# Patient Record
Sex: Female | Born: 1970
Health system: Southern US, Community
[De-identification: ages and names within clinical notes are randomized; demographics above are authoritative.]

## PROBLEM LIST (undated history)

## (undated) DIAGNOSIS — I7775 Dissection of other precerebral arteries: Secondary | ICD-10-CM

## (undated) HISTORY — PX: ABDOMINAL HYSTERECTOMY: SHX81

---

## 2013-08-07 ENCOUNTER — Emergency Department (HOSPITAL_BASED_OUTPATIENT_CLINIC_OR_DEPARTMENT_OTHER)
Admission: EM | Admit: 2013-08-07 | Discharge: 2013-08-07 | Disposition: A | Discharge status: Home / Self Care | Payer: PRIVATE HEALTH INSURANCE | Attending: Emergency Medicine | Admitting: Emergency Medicine

## 2013-08-07 ENCOUNTER — Encounter (HOSPITAL_BASED_OUTPATIENT_CLINIC_OR_DEPARTMENT_OTHER): Payer: Self-pay | Admitting: *Deleted

## 2013-08-07 ENCOUNTER — Emergency Department (HOSPITAL_BASED_OUTPATIENT_CLINIC_OR_DEPARTMENT_OTHER): Payer: PRIVATE HEALTH INSURANCE

## 2013-08-07 DIAGNOSIS — J209 Acute bronchitis, unspecified: Secondary | ICD-10-CM | POA: Insufficient documentation

## 2013-08-07 DIAGNOSIS — Z880 Allergy status to penicillin: Secondary | ICD-10-CM | POA: Insufficient documentation

## 2013-08-07 DIAGNOSIS — R0602 Shortness of breath: Secondary | ICD-10-CM | POA: Insufficient documentation

## 2013-08-07 DIAGNOSIS — R0789 Other chest pain: Secondary | ICD-10-CM | POA: Insufficient documentation

## 2013-08-07 DIAGNOSIS — R05 Cough: Secondary | ICD-10-CM | POA: Insufficient documentation

## 2013-08-07 DIAGNOSIS — J40 Bronchitis, not specified as acute or chronic: Secondary | ICD-10-CM

## 2013-08-07 DIAGNOSIS — R059 Cough, unspecified: Secondary | ICD-10-CM | POA: Insufficient documentation

## 2013-08-07 LAB — CBC WITH DIFFERENTIAL/PLATELET
Eosinophils Absolute: 0.5 10*3/uL (ref 0.0–0.7)
Eosinophils Relative: 5 % (ref 0–5)
HCT: 39.7 % (ref 36.0–46.0)
Hemoglobin: 13.2 g/dL (ref 12.0–15.0)
Lymphs Abs: 2.5 10*3/uL (ref 0.7–4.0)
MCH: 28.6 pg (ref 26.0–34.0)
MCV: 85.9 fL (ref 78.0–100.0)
Monocytes Relative: 6 % (ref 3–12)
RBC: 4.62 MIL/uL (ref 3.87–5.11)

## 2013-08-07 LAB — BASIC METABOLIC PANEL
BUN: 15 mg/dL (ref 6–23)
Calcium: 9.7 mg/dL (ref 8.4–10.5)
GFR calc non Af Amer: 79 mL/min — ABNORMAL LOW (ref >90)
Glucose, Bld: 104 mg/dL — ABNORMAL HIGH (ref 70–99)

## 2013-08-07 LAB — PRO B NATRIURETIC PEPTIDE: Pro B Natriuretic peptide (BNP): 24.9 pg/mL (ref 0–125)

## 2013-08-07 MED ORDER — AEROCHAMBER Z-STAT PLUS/MEDIUM MISC
1.0000 | Freq: Once | Status: AC
  Administered 2013-08-07: 1
  Filled 2013-08-07: qty 1

## 2013-08-07 MED ORDER — AZITHROMYCIN 250 MG PO TABS: ORAL_TABLET | ORAL | Status: DC

## 2013-08-07 MED ORDER — ALBUTEROL SULFATE HFA 108 (90 BASE) MCG/ACT IN AERS
2.0000 | INHALATION_SPRAY | RESPIRATORY_TRACT | Status: DC | PRN
  Administered 2013-08-07: 2 via RESPIRATORY_TRACT
  Filled 2013-08-07: qty 6.7

## 2013-08-07 NOTE — ED Provider Notes (Signed)
CSN: [id number]     Arrival date & time 08/07/13  4098 History     First MD Initiated Contact with Patient 08/07/13 1019     Chief Complaint  Patient presents with  . Chest Pain   (Consider location/radiation/quality/duration/timing/severity/associated sxs/prior Treatment) HPI Comments: Patient presents with one week history of central chest pain that is constant. It does not radiate. It is worse with coughing. She endorses a two-month history of productive cough with yellow mucus. Denies having this investigated. Denies any fevers, chills, weight loss, nausea or vomiting. She's felt warm and checked her temperature. Good by mouth intake and urine output. Denies any sick contacts. She does not smoke. Denies any cardiac history. Pain is better with rest and worse with palpation and movement of the chest.  The history is provided by the patient.    History reviewed. No pertinent past medical history. Past Surgical History  Procedure Laterality Date  . Abdominal hysterectomy     No family history on file. History  Substance Use Topics  . Smoking status: Never Smoker   . Smokeless tobacco: Never Used  . Alcohol Use: No   OB History   Grav Para Term Preterm Abortions TAB SAB Ect Mult Living                 Review of Systems  Constitutional: Negative for fever, activity change and appetite change.  HENT: Negative for congestion and rhinorrhea.   Respiratory: Positive for cough, chest tightness and shortness of breath.   Cardiovascular: Positive for chest pain.  Gastrointestinal: Negative for nausea, vomiting and abdominal pain.  Genitourinary: Negative for dysuria, hematuria, vaginal bleeding and vaginal discharge.  Musculoskeletal: Negative for back pain.  Skin: Negative for rash.  Neurological: Negative for headaches.  A complete 10 system review of systems was obtained and all systems are negative except as noted in the HPI and PMH.    Allergies  Penicillins  Home  Medications   Current Outpatient Rx  Name  Route  Sig  Dispense  Refill  . azithromycin (ZITHROMAX Z-PAK) 250 MG tablet      2 tablets PO today, then 1 tablet PO daily   6 each   0    BP 110/67  Pulse 95  Temp(Src) 98.9 F (37.2 C) (Oral)  Resp 16  SpO2 98% Physical Exam  Constitutional: She is oriented to person, place, and time. She appears well-developed and well-nourished. No distress.  HENT:  Head: Normocephalic and atraumatic.  Mouth/Throat: Oropharynx is clear and moist. No oropharyngeal exudate.  Eyes: Conjunctivae and EOM are normal. Pupils are equal, round, and reactive to light.  Neck: Normal range of motion. Neck supple.  Cardiovascular: Normal rate, regular rhythm and normal heart sounds.   No murmur heard. Pulmonary/Chest: Effort normal and breath sounds normal. No respiratory distress. She exhibits tenderness.  TTP sternal and L costochondral junction. No rash  Abdominal: Soft. There is no tenderness. There is no rebound and no guarding.  Musculoskeletal: Normal range of motion. She exhibits no edema and no tenderness.  Neurological: She is alert and oriented to person, place, and time. No cranial nerve deficit. She exhibits normal muscle tone. Coordination normal.  Skin: Skin is warm.    ED Course   Procedures (including critical care time)  Labs Reviewed  BASIC METABOLIC PANEL - Abnormal; Notable for the following:    Glucose, Bld 104 (*)    GFR calc non Af Amer 79 (*)    All other components within  normal limits  CBC WITH DIFFERENTIAL  TROPONIN I  D-DIMER, QUANTITATIVE  PRO B NATRIURETIC PEPTIDE   Dg Chest 2 View  08/07/2013   *RADIOLOGY REPORT*  Clinical Data: Chest pain, cough  CHEST - 2 VIEW  Comparison: None.  Findings: Cardiomediastinal silhouette is unremarkable.  No acute infiltrate or pleural effusion.  No pulmonary edema.  Mild basilar atelectasis.  Bony thorax is unremarkable.  IMPRESSION: No acute infiltrate or pulmonary edema.  Mild  basilar atelectasis.   Original Report Authenticated By: Natasha Mead, M.D.   1. Bronchitis     MDM  One week of constant chest pain associated with two-month history of cough. No distress. No hypoxia. Lungs clear. Chest pain is constant and reproducible and atypical for ACS. CXR negative. D-dimer negative. No desaturation with ambulation. Will treat for bronchitis with zithromax.  Needs to establish care with PCP.   Date: 08/07/2013  Rate: 86  Rhythm: normal sinus rhythm  QRS Axis: normal  Intervals: normal  ST/T Wave abnormalities: normal  Conduction Disutrbances:none  Narrative Interpretation:   Old EKG Reviewed: Alla German, MD 08/07/13 [phone]

## 2013-08-07 NOTE — ED Notes (Signed)
Patient states she has a one week history of central chest pain which she describes as a constant burning pain.  States she has a two month history of a productive cough with yellow secretions.  Pain is associated with sob while coughing.

## 2013-08-07 NOTE — ED Notes (Signed)
HR 97 resting aftering walking one lap in ER HR was 112.  SPO2 100 resting aftering walking one lap in ER SPO2 was 100.  Patient did have some increased WOB.

## 2016-01-06 ENCOUNTER — Encounter (HOSPITAL_BASED_OUTPATIENT_CLINIC_OR_DEPARTMENT_OTHER): Payer: Self-pay | Admitting: Emergency Medicine

## 2016-01-06 ENCOUNTER — Emergency Department (HOSPITAL_BASED_OUTPATIENT_CLINIC_OR_DEPARTMENT_OTHER): Payer: BLUE CROSS/BLUE SHIELD

## 2016-01-06 ENCOUNTER — Emergency Department (HOSPITAL_BASED_OUTPATIENT_CLINIC_OR_DEPARTMENT_OTHER)
Admission: EM | Admit: 2016-01-06 | Discharge: 2016-01-06 | Disposition: A | Discharge status: Home / Self Care | Payer: BLUE CROSS/BLUE SHIELD | Attending: Emergency Medicine | Admitting: Emergency Medicine

## 2016-01-06 DIAGNOSIS — R109 Unspecified abdominal pain: Secondary | ICD-10-CM | POA: Insufficient documentation

## 2016-01-06 DIAGNOSIS — M7989 Other specified soft tissue disorders: Secondary | ICD-10-CM

## 2016-01-06 DIAGNOSIS — Z792 Long term (current) use of antibiotics: Secondary | ICD-10-CM | POA: Diagnosis not present

## 2016-01-06 DIAGNOSIS — R197 Diarrhea, unspecified: Secondary | ICD-10-CM | POA: Insufficient documentation

## 2016-01-06 DIAGNOSIS — M79661 Pain in right lower leg: Secondary | ICD-10-CM | POA: Diagnosis not present

## 2016-01-06 DIAGNOSIS — J029 Acute pharyngitis, unspecified: Secondary | ICD-10-CM | POA: Diagnosis not present

## 2016-01-06 DIAGNOSIS — M549 Dorsalgia, unspecified: Secondary | ICD-10-CM | POA: Diagnosis not present

## 2016-01-06 DIAGNOSIS — M79662 Pain in left lower leg: Secondary | ICD-10-CM | POA: Diagnosis not present

## 2016-01-06 DIAGNOSIS — R630 Anorexia: Secondary | ICD-10-CM | POA: Insufficient documentation

## 2016-01-06 DIAGNOSIS — Z880 Allergy status to penicillin: Secondary | ICD-10-CM | POA: Diagnosis not present

## 2016-01-06 DIAGNOSIS — R51 Headache: Secondary | ICD-10-CM | POA: Diagnosis not present

## 2016-01-06 DIAGNOSIS — R079 Chest pain, unspecified: Secondary | ICD-10-CM | POA: Insufficient documentation

## 2016-01-06 DIAGNOSIS — M79606 Pain in leg, unspecified: Secondary | ICD-10-CM

## 2016-01-06 LAB — CBC WITH DIFFERENTIAL/PLATELET
Basophils Absolute: 0.1 K/uL (ref 0.0–0.1)
Basophils Relative: 1 %
Eosinophils Absolute: 0.3 K/uL (ref 0.0–0.7)
Eosinophils Relative: 3 %
HCT: 41.6 % (ref 36.0–46.0)
Hemoglobin: 13.3 g/dL (ref 12.0–15.0)
Lymphocytes Relative: 24 %
Lymphs Abs: 2.5 K/uL (ref 0.7–4.0)
MCH: 27.2 pg (ref 26.0–34.0)
MCHC: 32 g/dL (ref 30.0–36.0)
MCV: 85.1 fL (ref 78.0–100.0)
Monocytes Absolute: 0.6 K/uL (ref 0.1–1.0)
Monocytes Relative: 6 %
Neutro Abs: 7.1 K/uL (ref 1.7–7.7)
Neutrophils Relative %: 66 %
Platelets: 304 K/uL (ref 150–400)
RBC: 4.89 MIL/uL (ref 3.87–5.11)
RDW: 12.6 % (ref 11.5–15.5)
WBC: 10.5 K/uL (ref 4.0–10.5)

## 2016-01-06 LAB — BASIC METABOLIC PANEL
ANION GAP: 10 (ref 5–15)
BUN: 10 mg/dL (ref 6–20)
CALCIUM: 8.3 mg/dL — AB (ref 8.9–10.3)
CO2: 26 mmol/L (ref 22–32)
CREATININE: 0.76 mg/dL (ref 0.44–1.00)
Chloride: 105 mmol/L (ref 101–111)
GFR calc Af Amer: >60 mL/min (ref >60)
GFR calc non Af Amer: >60 mL/min (ref >60)
GLUCOSE: 86 mg/dL (ref 65–99)
Potassium: 3.8 mmol/L (ref 3.5–5.1)
Sodium: 141 mmol/L (ref 135–145)

## 2016-01-06 LAB — CK: Total CK: 177 U/L (ref 38–234)

## 2016-01-06 LAB — D-DIMER, QUANTITATIVE: D-Dimer, Quant: <0.27 ug{FEU}/mL (ref 0.00–0.50)

## 2016-01-06 MED ORDER — SODIUM CHLORIDE 0.9 % IV BOLUS (SEPSIS)
1000.0000 mL | Freq: Once | INTRAVENOUS | Status: AC
  Administered 2016-01-06: 1000 mL via INTRAVENOUS

## 2016-01-06 NOTE — ED Notes (Signed)
Pt reports that she has had bilateral leg swelling since first of the year along with pain in bilateral calfs

## 2016-01-06 NOTE — ED Provider Notes (Signed)
CSN: [id number]     Arrival date & time 01/06/16  1058 History   First MD Initiated Contact with Patient 01/06/16 1123     Chief Complaint  Patient presents with  . Leg Swelling     (Consider location/radiation/quality/duration/timing/severity/associated sxs/prior Treatment) Patient is a 45 y.o. female presenting with leg pain.  Leg Pain Location:  Leg Leg location:  R lower leg and L lower leg Pain details:    Quality:  Aching, cramping and sharp   Radiates to:  Does not radiate   Severity:  No pain   Onset quality:  Gradual   Duration:  3 days   Timing:  Constant   Progression:  Worsening Chronicity:  New Dislocation: no   Prior injury to area:  No Relieved by:  None tried Worsened by:  Nothing tried Ineffective treatments:  None tried Associated symptoms: back pain   Associated symptoms: no fatigue and no muscle weakness     History reviewed. No pertinent past medical history. Past Surgical History  Procedure Laterality Date  . Abdominal hysterectomy     History reviewed. No pertinent family history. Social History  Substance Use Topics  . Smoking status: Never Smoker   . Smokeless tobacco: Never Used  . Alcohol Use: No   OB History    No data available     Review of Systems  Constitutional: Positive for activity change. Negative for fatigue.  HENT: Positive for sore throat.   Eyes: Negative for pain and redness.  Respiratory: Negative for cough and shortness of breath.   Cardiovascular: Positive for chest pain and leg swelling.  Gastrointestinal: Positive for abdominal pain and diarrhea. Negative for nausea and vomiting.  Endocrine: Negative for polydipsia, polyphagia and polyuria.  Genitourinary: Negative for dysuria and dyspareunia.  Musculoskeletal: Positive for back pain and arthralgias.  Neurological: Positive for headaches. Negative for syncope.  All other systems reviewed and are negative.     Allergies  Penicillins  Home Medications    Prior to Admission medications   Medication Sig Start Date End Date Taking? Authorizing Provider  ibuprofen (ADVIL,MOTRIN) 200 MG tablet Take 200 mg by mouth every 6 (six) hours as needed.   Yes Historical Provider, MD  azithromycin (ZITHROMAX Z-PAK) 250 MG tablet 2 tablets PO today, then 1 tablet PO daily 08/07/13   Glynn Octave, MD   BP 148/99 mmHg  Pulse 98  Temp(Src) 98.2 F (36.8 C) (Oral)  Resp 20  Ht  (1.549 m)  Wt 210 lb (95.255 kg)  BMI 39.70 kg/m2  SpO2 100% Physical Exam  Constitutional: She is oriented to person, place, and time. She appears well-developed and well-nourished.  HENT:  Head: Normocephalic and atraumatic.  Neck: Normal range of motion.  Cardiovascular: Normal rate and regular rhythm.   Pulmonary/Chest: Effort normal and breath sounds normal. No stridor. No respiratory distress. She has no wheezes.  Abdominal: She exhibits no distension.  Neurological: She is alert and oriented to person, place, and time. No cranial nerve deficit.  Skin: Skin is warm and dry.  Nursing note and vitals reviewed.   ED Course  Procedures (including critical care time) Labs Review Labs Reviewed  BASIC METABOLIC PANEL - Abnormal; Notable for the following:    Calcium 8.3 (*)    All other components within normal limits  CBC WITH DIFFERENTIAL/PLATELET  D-DIMER, QUANTITATIVE (NOT AT [place])  CK  CBC WITH DIFFERENTIAL/PLATELET    Imaging Review Dg Chest 2 View  01/06/2016  CLINICAL DATA:  Bilateral lower  leg pain and swelling, right greater than left. Shortness of breath starting 18 days ago EXAM: CHEST  2 VIEW COMPARISON:  08/07/2013 FINDINGS: The heart size and mediastinal contours are within normal limits. Both lungs are clear. The visualized skeletal structures are unremarkable. IMPRESSION: No active cardiopulmonary disease. Electronically Signed   By: Gaylyn Rong M.D.   On: 01/06/2016 12:59   I have personally reviewed and evaluated these images and lab  results as part of my medical decision-making.   EKG Interpretation   Date/Time:  Thursday January 06 2016 12:13:21 EST Ventricular Rate:  92 PR Interval:  168 QRS Duration: 94 QT Interval:  381 QTC Calculation: 471 R Axis:   47 Text Interpretation:  Sinus rhythm Baseline wander in lead(s) II No  significant change since last tracing 08/07/13 Confirmed by [facility] MD,  Barbara Cower [phone]) on 01/06/2016 12:23:44 PM      MDM   Final diagnoses:  Pain of lower extremity, unspecified laterality  Leg swelling    Pan positive ROS however it seems the patient is mainly here for BLE swelling worsening over last few days, improving at night and persistent distal leg pain as well. 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 Able to ambulate, will dc to fu w/ PCP and use iburpofen and continue weight loss management.     Marily Memos, MD 01/06/16 1524

## 2016-03-13 ENCOUNTER — Emergency Department (HOSPITAL_BASED_OUTPATIENT_CLINIC_OR_DEPARTMENT_OTHER)
Admission: EM | Admit: 2016-03-13 | Discharge: 2016-03-13 | Disposition: A | Discharge status: Home / Self Care | Payer: BLUE CROSS/BLUE SHIELD | Attending: Emergency Medicine | Admitting: Emergency Medicine

## 2016-03-13 ENCOUNTER — Emergency Department (HOSPITAL_BASED_OUTPATIENT_CLINIC_OR_DEPARTMENT_OTHER): Payer: BLUE CROSS/BLUE SHIELD

## 2016-03-13 ENCOUNTER — Encounter (HOSPITAL_BASED_OUTPATIENT_CLINIC_OR_DEPARTMENT_OTHER): Payer: Self-pay | Admitting: *Deleted

## 2016-03-13 DIAGNOSIS — J069 Acute upper respiratory infection, unspecified: Secondary | ICD-10-CM | POA: Diagnosis not present

## 2016-03-13 DIAGNOSIS — R059 Cough, unspecified: Secondary | ICD-10-CM

## 2016-03-13 DIAGNOSIS — R05 Cough: Secondary | ICD-10-CM | POA: Diagnosis present

## 2016-03-13 DIAGNOSIS — Z792 Long term (current) use of antibiotics: Secondary | ICD-10-CM | POA: Diagnosis not present

## 2016-03-13 DIAGNOSIS — R112 Nausea with vomiting, unspecified: Secondary | ICD-10-CM | POA: Insufficient documentation

## 2016-03-13 DIAGNOSIS — Z880 Allergy status to penicillin: Secondary | ICD-10-CM | POA: Insufficient documentation

## 2016-03-13 MED ORDER — PHENYLEPH-CARBETAPENTANE-GG 10-25-400 MG PO CAPS: 1.0000 | ORAL_CAPSULE | Freq: Four times a day (QID) | ORAL | Status: DC | PRN

## 2016-03-13 NOTE — Discharge Instructions (Signed)

## 2016-03-13 NOTE — ED Provider Notes (Signed)
CSN: [id number]     Arrival date & time 03/13/16  1521 History   First MD Initiated Contact with Patient 03/13/16 1608     Chief Complaint  Patient presents with  . Cough     (Consider location/radiation/quality/duration/timing/severity/associated sxs/prior Treatment) Patient is a 45 y.o. female presenting with cough. The history is provided by the patient. No language interpreter was used.  Cough Cough characteristics:  Productive Sputum characteristics:  Yellow Severity:  Moderate Onset quality:  Gradual Duration:  8 weeks Timing:  Intermittent Progression:  Waxing and waning Chronicity:  Recurrent Smoker: no   Associated symptoms: fever and rhinorrhea     History reviewed. No pertinent past medical history. Past Surgical History  Procedure Laterality Date  . Abdominal hysterectomy     No family history on file. Social History  Substance Use Topics  . Smoking status: Never Smoker   . Smokeless tobacco: Never Used  . Alcohol Use: No   OB History    No data available     Review of Systems  Constitutional: Positive for fever.  HENT: Positive for rhinorrhea.   Respiratory: Positive for cough.   Gastrointestinal: Positive for nausea and vomiting.  All other systems reviewed and are negative.     Allergies  Penicillins  Home Medications   Prior to Admission medications   Medication Sig Start Date End Date Taking? Authorizing Provider  azithromycin (ZITHROMAX Z-PAK) 250 MG tablet 2 tablets PO today, then 1 tablet PO daily 08/07/13   Glynn OctaveStephen Rancour, MD  ibuprofen (ADVIL,MOTRIN) 200 MG tablet Take 200 mg by mouth every 6 (six) hours as needed.    Historical Provider, MD   BP 142/98 mmHg  Pulse 118  Temp(Src) 99.2 F (37.3 C) (Oral)  Resp 18  Ht 5\' 2"  (1.575 m)  Wt 95.255 kg  BMI 38.40 kg/m2  SpO2 98% Physical Exam  Constitutional: She is oriented to person, place, and time. She appears well-developed and well-nourished.  HENT:  Head: Normocephalic.   Mouth/Throat: No oropharyngeal exudate.  Eyes: Conjunctivae are normal.  Neck: Neck supple.  Cardiovascular: Regular rhythm and intact distal pulses.   Pulmonary/Chest: Effort normal and breath sounds normal. No respiratory distress. She has no wheezes.  Abdominal: Soft.  Musculoskeletal: She exhibits no edema or tenderness.  Lymphadenopathy:    She has no cervical adenopathy.  Neurological: She is alert and oriented to person, place, and time.  Skin: Skin is warm and dry.  Psychiatric: She has a normal mood and affect.  Nursing note and vitals reviewed.   ED Course  Procedures (including critical care time) Labs Review Labs Reviewed - No data to display  Imaging Review Dg Chest 2 View  03/13/2016  CLINICAL DATA:  Cough for 2 months EXAM: CHEST  2 VIEW COMPARISON:  02/29/2016 FINDINGS: The heart size and mediastinal contours are within normal limits. Both lungs are clear. The visualized skeletal structures are unremarkable. IMPRESSION: No active cardiopulmonary disease. Electronically Signed   By: Alcide CleverMark  Lukens M.D.   On: 03/13/2016 15:41   I have personally reviewed and evaluated these images and lab results as part of my medical decision-making.   EKG Interpretation None     Radiology results reviewed and shared with patient. MDM   Final diagnoses:  None    Viral URI with cough. Symptomatic care instructions provided. Return precautions discussed. Follow-up with PCP.    Felicie Mornavid Kailyn Vanderslice, NP 03/14/16 [id number]  Richardean Canalavid H Yao, MD 03/14/16 [phone]

## 2016-03-13 NOTE — ED Notes (Signed)
Cough x 2 months. Thick yellow sputum.

## 2017-01-29 ENCOUNTER — Encounter (HOSPITAL_BASED_OUTPATIENT_CLINIC_OR_DEPARTMENT_OTHER): Payer: Self-pay | Admitting: *Deleted

## 2017-01-29 ENCOUNTER — Emergency Department (HOSPITAL_BASED_OUTPATIENT_CLINIC_OR_DEPARTMENT_OTHER)
Admission: EM | Admit: 2017-01-29 | Discharge: 2017-01-29 | Disposition: A | Discharge status: Home / Self Care | Payer: PRIVATE HEALTH INSURANCE | Attending: Emergency Medicine | Admitting: Emergency Medicine

## 2017-01-29 DIAGNOSIS — R69 Illness, unspecified: Secondary | ICD-10-CM

## 2017-01-29 DIAGNOSIS — J111 Influenza due to unidentified influenza virus with other respiratory manifestations: Secondary | ICD-10-CM

## 2017-01-29 DIAGNOSIS — H66002 Acute suppurative otitis media without spontaneous rupture of ear drum, left ear: Secondary | ICD-10-CM | POA: Insufficient documentation

## 2017-01-29 DIAGNOSIS — R197 Diarrhea, unspecified: Secondary | ICD-10-CM | POA: Insufficient documentation

## 2017-01-29 DIAGNOSIS — R112 Nausea with vomiting, unspecified: Secondary | ICD-10-CM | POA: Insufficient documentation

## 2017-01-29 DIAGNOSIS — J0110 Acute frontal sinusitis, unspecified: Secondary | ICD-10-CM

## 2017-01-29 LAB — COMPREHENSIVE METABOLIC PANEL
ALBUMIN: 3.5 g/dL (ref 3.5–5.0)
ALT: 16 U/L (ref 14–54)
ANION GAP: 9 (ref 5–15)
AST: 26 U/L (ref 15–41)
Alkaline Phosphatase: 80 U/L (ref 38–126)
BILIRUBIN TOTAL: 0.6 mg/dL (ref 0.3–1.2)
BUN: 13 mg/dL (ref 6–20)
CHLORIDE: 104 mmol/L (ref 101–111)
CO2: 30 mmol/L (ref 22–32)
Calcium: 8.6 mg/dL — ABNORMAL LOW (ref 8.9–10.3)
Creatinine, Ser: 0.88 mg/dL (ref 0.44–1.00)
GFR calc Af Amer: >60 mL/min (ref >60)
GFR calc non Af Amer: >60 mL/min (ref >60)
GLUCOSE: 117 mg/dL — AB (ref 65–99)
POTASSIUM: 3.8 mmol/L (ref 3.5–5.1)
SODIUM: 143 mmol/L (ref 135–145)
TOTAL PROTEIN: 7.3 g/dL (ref 6.5–8.1)

## 2017-01-29 LAB — CBC WITH DIFFERENTIAL/PLATELET
BASOS PCT: 0 %
Basophils Absolute: 0 10*3/uL (ref 0.0–0.1)
EOS ABS: 0.2 10*3/uL (ref 0.0–0.7)
EOS PCT: 2 %
HCT: 43.7 % (ref 36.0–46.0)
Hemoglobin: 14.1 g/dL (ref 12.0–15.0)
Lymphocytes Relative: 15 %
Lymphs Abs: 1 10*3/uL (ref 0.7–4.0)
MCH: 27.8 pg (ref 26.0–34.0)
MCHC: 32.3 g/dL (ref 30.0–36.0)
MCV: 86.2 fL (ref 78.0–100.0)
MONO ABS: 0.7 10*3/uL (ref 0.1–1.0)
MONOS PCT: 11 %
Neutro Abs: 4.7 10*3/uL (ref 1.7–7.7)
Neutrophils Relative %: 72 %
PLATELETS: 242 10*3/uL (ref 150–400)
RBC: 5.07 MIL/uL (ref 3.87–5.11)
RDW: 12.8 % (ref 11.5–15.5)
WBC: 6.6 10*3/uL (ref 4.0–10.5)

## 2017-01-29 LAB — LIPASE, BLOOD: Lipase: 28 U/L (ref 11–51)

## 2017-01-29 MED ORDER — BENZONATATE 100 MG PO CAPS: 100.0000 mg | ORAL_CAPSULE | Freq: Three times a day (TID) | ORAL | 0 refills | Status: DC

## 2017-01-29 MED ORDER — ONDANSETRON HCL 4 MG/2ML IJ SOLN
4.0000 mg | Freq: Once | INTRAMUSCULAR | Status: AC
  Administered 2017-01-29: 4 mg via INTRAVENOUS
  Filled 2017-01-29: qty 2

## 2017-01-29 MED ORDER — HYDROCOD POLST-CPM POLST ER 10-8 MG/5ML PO SUER
5.0000 mL | Freq: Once | ORAL | Status: AC
  Administered 2017-01-29: 5 mL via ORAL
  Filled 2017-01-29: qty 5

## 2017-01-29 MED ORDER — IBUPROFEN 800 MG PO TABS
800.0000 mg | ORAL_TABLET | Freq: Once | ORAL | Status: AC
  Administered 2017-01-29: 800 mg via ORAL
  Filled 2017-01-29: qty 1

## 2017-01-29 MED ORDER — AMOXICILLIN-POT CLAVULANATE 875-125 MG PO TABS: 1.0000 | ORAL_TABLET | Freq: Two times a day (BID) | ORAL | 0 refills | Status: DC

## 2017-01-29 MED ORDER — ACETAMINOPHEN 500 MG PO TABS
1000.0000 mg | ORAL_TABLET | Freq: Once | ORAL | Status: AC
  Administered 2017-01-29: 1000 mg via ORAL
  Filled 2017-01-29: qty 2

## 2017-01-29 MED ORDER — SODIUM CHLORIDE 0.9 % IV BOLUS (SEPSIS)
1000.0000 mL | Freq: Once | INTRAVENOUS | Status: AC
  Administered 2017-01-29: 1000 mL via INTRAVENOUS

## 2017-01-29 MED FILL — AMOX-CLAV 875-125 MG TABLET: 875-125 | 7 days supply | Qty: 14 | Fill #0

## 2017-01-29 MED FILL — BENZONATATE 100 MG CAP: 100 | 7 days supply | Qty: 21 | Fill #0

## 2017-01-29 NOTE — ED Provider Notes (Signed)
[facility]   CSN: [id number] Arrival date & time: 01/29/17  [id number]     History   Chief Complaint Chief Complaint  Patient presents with  . vomting and cough    HPI Diane Reese is a 46 y.o. female.  46 yo F with a chief complaint of cough congestion fevers chills myalgias nausea vomiting and diarrhea. Husband has a similar illness. Going on for the past couple days. Thinks she's been having some wheezing as well. Denies history of smoking. Has history of asthma.   The history is provided by the patient.  Illness  This is a new problem. The current episode started more than 2 days ago. The problem occurs constantly. The problem has not changed since onset.Pertinent negatives include no chest pain, no headaches and no shortness of breath. Nothing aggravates the symptoms. Nothing relieves the symptoms. She has tried nothing for the symptoms. The treatment provided no relief.    History reviewed. No pertinent past medical history.  There are no active problems to display for this patient.   Past Surgical History:  Procedure Laterality Date  . ABDOMINAL HYSTERECTOMY      OB History    No data available       Home Medications    Prior to Admission medications   Medication Sig Start Date End Date Taking? Authorizing Provider  azithromycin (ZITHROMAX Z-PAK) 250 MG tablet 2 tablets PO today, then 1 tablet PO daily 08/07/13   Glynn OctaveStephen Rancour, MD  ibuprofen (ADVIL,MOTRIN) 200 MG tablet Take 200 mg by mouth every 6 (six) hours as needed.    Historical Provider, MD  Phenyleph-Carbetapentane-GG 10-25-400 MG CAPS Take 1 capsule by mouth every 6 (six) hours as needed. 03/13/16   Felicie Mornavid Smith, NP    Family History No family history on file.  Social History Social History  Substance Use Topics  . Smoking status: Never Smoker  . Smokeless tobacco: Never Used  . Alcohol use No     Allergies   Penicillins   Review of Systems Review of Systems    Constitutional: Negative for chills and fever.  HENT: Positive for congestion. Negative for rhinorrhea.   Eyes: Negative for redness and visual disturbance.  Respiratory: Positive for cough. Negative for shortness of breath and wheezing.   Cardiovascular: Negative for chest pain and palpitations.  Gastrointestinal: Positive for diarrhea, nausea and vomiting.  Genitourinary: Negative for dysuria and urgency.  Musculoskeletal: Positive for arthralgias and myalgias.  Skin: Negative for pallor and wound.  Neurological: Negative for dizziness and headaches.     Physical Exam Updated Vital Signs BP (!) 150/104 (BP Location: Left Arm) Comment (BP Location): left forearm  Pulse 96   Temp 99.7 F (37.6 C) (Oral)   Resp 16   Ht 5\' 1"  (1.549 m)   Wt 239 lb (108.4 kg)   SpO2 96%   BMI 45.16 kg/m   Physical Exam  Constitutional: She is oriented to person, place, and time. She appears well-developed and well-nourished. No distress.  HENT:  Head: Normocephalic and atraumatic.  Left TM with effusion, erythema and slight bulging  Eyes: EOM are normal. Pupils are equal, round, and reactive to light.  Neck: Normal range of motion. Neck supple.  Cardiovascular: Normal rate and regular rhythm.  Exam reveals no gallop and no friction rub.   No murmur heard. Pulmonary/Chest: Effort normal. She has no wheezes. She has no rales.  Abdominal: Soft. She exhibits no distension and no mass. There is no tenderness.  There is no guarding.  Musculoskeletal: She exhibits no edema or tenderness.  Neurological: She is alert and oriented to person, place, and time.  Skin: Skin is warm and dry. She is not diaphoretic.  Psychiatric: She has a normal mood and affect. Her behavior is normal.  Nursing note and vitals reviewed.    ED Treatments / Results  Labs (all labs ordered are listed, but only abnormal results are displayed) Labs Reviewed  CBC WITH DIFFERENTIAL/PLATELET  COMPREHENSIVE METABOLIC PANEL   LIPASE, BLOOD    EKG  EKG Interpretation None       Radiology No results found.  Procedures Procedures (including critical care time)  Medications Ordered in ED Medications  sodium chloride 0.9 % bolus 1,000 mL (not administered)  ondansetron (ZOFRAN) injection 4 mg (not administered)  acetaminophen (TYLENOL) tablet 1,000 mg (not administered)  ibuprofen (ADVIL,MOTRIN) tablet 800 mg (not administered)  chlorpheniramine-HYDROcodone (TUSSIONEX) 10-8 MG/5ML suspension 5 mL (not administered)     Initial Impression / Assessment and Plan / ED Course  I have reviewed the triage vital signs and the nursing notes.  Pertinent labs & imaging results that were available during my care of the patient were reviewed by me and considered in my medical decision making (see chart for details).     46 yo F With a chief complaints of influenza-like illness. Patient does have right frontal sinus tenderness and likely otitis media. Will cover with antibiotics.  7:34 AM:  I have discussed the diagnosis/risks/treatment options with the patient and believe the pt to be eligible for discharge home to follow-up with PCP. We also discussed returning to the ED immediately if new or worsening sx occur. We discussed the sx which are most concerning (e.g., sudden worsening pain, fever, inability to tolerate by mouth) that necessitate immediate return. Medications administered to the patient during their visit and any new prescriptions provided to the patient are listed below.  Medications given during this visit Medications  sodium chloride 0.9 % bolus 1,000 mL (not administered)  ondansetron (ZOFRAN) injection 4 mg (not administered)  acetaminophen (TYLENOL) tablet 1,000 mg (not administered)  ibuprofen (ADVIL,MOTRIN) tablet 800 mg (not administered)  chlorpheniramine-HYDROcodone (TUSSIONEX) 10-8 MG/5ML suspension 5 mL (not administered)     The patient appears reasonably screen and/or stabilized  for discharge and I doubt any other medical condition or other [facility] requiring further screening, evaluation, or treatment in the ED at this time prior to discharge.    Final Clinical Impressions(s) / ED Diagnoses   Final diagnoses:  Influenza-like illness  Acute frontal sinusitis, recurrence not specified  Acute suppurative otitis media of left ear without spontaneous rupture of tympanic membrane, recurrence not specified    New Prescriptions New Prescriptions   No medications on file     Melene Plan, DO 01/29/17 1518

## 2017-01-29 NOTE — ED Triage Notes (Addendum)
Pt states her husband has been sick with the stomach virus. States on Sat she started with vomiting and diarrhea. Denies fevers. C/o cough and congestion that started on Friday as well. States she has also had some wheezing as well. States hx of bronchitis but denies asthma history. Vomiting and diarrhea 30 min pta. Has tried ibuprofen without relief. Sob with exertion. Denies any at present. Faint wheezing noted on exam.

## 2017-01-29 NOTE — Discharge Instructions (Signed)
Take tylenol 2 pills 4 times a day and motrin 4 pills 3 times a day.  Drink plenty of fluids.  Return for worsening shortness of breath, headache, confusion. Follow up with your family doctor.   

## 2017-05-13 ENCOUNTER — Emergency Department (HOSPITAL_BASED_OUTPATIENT_CLINIC_OR_DEPARTMENT_OTHER): Payer: PRIVATE HEALTH INSURANCE

## 2017-05-13 ENCOUNTER — Emergency Department (HOSPITAL_BASED_OUTPATIENT_CLINIC_OR_DEPARTMENT_OTHER)
Admission: EM | Admit: 2017-05-13 | Discharge: 2017-05-13 | Disposition: A | Discharge status: Home / Self Care | Payer: PRIVATE HEALTH INSURANCE | Attending: Emergency Medicine | Admitting: Emergency Medicine

## 2017-05-13 ENCOUNTER — Encounter (HOSPITAL_BASED_OUTPATIENT_CLINIC_OR_DEPARTMENT_OTHER): Payer: Self-pay | Admitting: Emergency Medicine

## 2017-05-13 DIAGNOSIS — M25551 Pain in right hip: Secondary | ICD-10-CM | POA: Insufficient documentation

## 2017-05-13 DIAGNOSIS — M5416 Radiculopathy, lumbar region: Secondary | ICD-10-CM | POA: Insufficient documentation

## 2017-05-13 LAB — COMPREHENSIVE METABOLIC PANEL
ALBUMIN: 3.6 g/dL (ref 3.5–5.0)
ALT: 15 U/L (ref 14–54)
AST: 18 U/L (ref 15–41)
Alkaline Phosphatase: 86 U/L (ref 38–126)
Anion gap: 9 (ref 5–15)
BUN: 16 mg/dL (ref 6–20)
CHLORIDE: 105 mmol/L (ref 101–111)
CO2: 27 mmol/L (ref 22–32)
CREATININE: 0.81 mg/dL (ref 0.44–1.00)
Calcium: 9 mg/dL (ref 8.9–10.3)
GFR calc Af Amer: >60 mL/min (ref >60)
GLUCOSE: 97 mg/dL (ref 65–99)
POTASSIUM: 3.6 mmol/L (ref 3.5–5.1)
SODIUM: 141 mmol/L (ref 135–145)
Total Bilirubin: 0.3 mg/dL (ref 0.3–1.2)
Total Protein: 7.1 g/dL (ref 6.5–8.1)

## 2017-05-13 LAB — CBC WITH DIFFERENTIAL/PLATELET
BASOS ABS: 0 10*3/uL (ref 0.0–0.1)
BASOS PCT: 0 %
Eosinophils Absolute: 0.2 10*3/uL (ref 0.0–0.7)
Eosinophils Relative: 2 %
HCT: 39.5 % (ref 36.0–46.0)
Hemoglobin: 13 g/dL (ref 12.0–15.0)
LYMPHS PCT: 27 %
Lymphs Abs: 2.9 10*3/uL (ref 0.7–4.0)
MCH: 28.3 pg (ref 26.0–34.0)
MCHC: 32.9 g/dL (ref 30.0–36.0)
MCV: 86.1 fL (ref 78.0–100.0)
Monocytes Absolute: 0.7 10*3/uL (ref 0.1–1.0)
Monocytes Relative: 6 %
Neutro Abs: 7.1 10*3/uL (ref 1.7–7.7)
Neutrophils Relative %: 65 %
PLATELETS: 315 10*3/uL (ref 150–400)
RBC: 4.59 MIL/uL (ref 3.87–5.11)
RDW: 12.4 % (ref 11.5–15.5)
WBC: 10.8 10*3/uL — AB (ref 4.0–10.5)

## 2017-05-13 LAB — URINALYSIS, ROUTINE W REFLEX MICROSCOPIC
Bilirubin Urine: NEGATIVE
Glucose, UA: NEGATIVE mg/dL
HGB URINE DIPSTICK: NEGATIVE
KETONES UR: NEGATIVE mg/dL
LEUKOCYTES UA: NEGATIVE
Nitrite: NEGATIVE
PROTEIN: NEGATIVE mg/dL
Specific Gravity, Urine: 1.027 (ref 1.005–1.030)
pH: 6 (ref 5.0–8.0)

## 2017-05-13 MED ORDER — KETOROLAC TROMETHAMINE 15 MG/ML IJ SOLN
15.0000 mg | Freq: Once | INTRAMUSCULAR | Status: AC
  Administered 2017-05-13: 15 mg via INTRAVENOUS
  Filled 2017-05-13: qty 1

## 2017-05-13 MED ORDER — CYCLOBENZAPRINE HCL 10 MG PO TABS: 10.0000 mg | ORAL_TABLET | Freq: Two times a day (BID) | ORAL | 0 refills | Status: DC | PRN

## 2017-05-13 MED ORDER — IOPAMIDOL (ISOVUE-300) INJECTION 61%
100.0000 mL | Freq: Once | INTRAVENOUS | Status: AC | PRN
  Administered 2017-05-13: 100 mL via INTRAVENOUS

## 2017-05-13 MED ORDER — PREDNISONE 20 MG PO TABS: 40.0000 mg | ORAL_TABLET | Freq: Every day | ORAL | 0 refills | Status: DC

## 2017-05-13 NOTE — ED Notes (Signed)
Started as constipation and really loss. Had 2 watery stools accompanied with slight nausea but nothing is coming up.

## 2017-05-13 NOTE — ED Notes (Signed)
Pt given d/c instructions as per chart. Rx x 2 with precautions. Verbalizes understanding. No questions. 

## 2017-05-13 NOTE — ED Triage Notes (Signed)
Lower abd pain and R hip pain x several months. Pt has been eval before for same.

## 2017-05-13 NOTE — ED Provider Notes (Signed)
[facility] Provider Note   CSN: [id number] Arrival date & time: 05/13/17  1637  By signing my name below, I, [name], attest that this documentation has been prepared under the direction and in the presence of [name], MD . Electronically Signed: [name], ED Scribe. 05/13/2017. 5:49 PM.    History   Chief Complaint Chief Complaint  Patient presents with  . Abdominal Pain  . Hip Pain    The history is provided by the patient. No language interpreter was used.   HPI Comments:  Diane Reese is a 46 y.o. female who presents to the Emergency Department complaining of ongoing lower abdominal pain x 1 month. She states that the pain is worse on the right side, and is exacerbated when laying down and moving her right leg. Pt states that the pain radiates to her right hip and completely down the right leg. She notes that the pain was at first periumbilical but has since moved to the right side. She reports previous similar pain 2 months ago that was intermittent, but the pain has been constant for 1 month now. Pt states that the pain is similar to when she previously had an ovarian cysts, and she has had a hysterectomy since. Pt had a colonoscopy 10 years ago that was normal. She denies any injury/trauma. Pt complains of associated diarrhea x 2 weeks. She has taken advil and Aleve with mild, temporary relief. Denies fever, urinary symptoms.   History reviewed. No pertinent past medical history.  There are no active problems to display for this patient.   Past Surgical History:  Procedure Laterality Date  . ABDOMINAL HYSTERECTOMY      OB History    No data available       Home Medications    Prior to Admission medications   Medication Sig Start Date End Date Taking? Authorizing Provider  amoxicillin-clavulanate (AUGMENTIN) 875-125 MG tablet Take 1 tablet by mouth every 12 (twelve) hours. 01/29/17   Melene Plan, DO  azithromycin (ZITHROMAX  Z-PAK) 250 MG tablet 2 tablets PO today, then 1 tablet PO daily 08/07/13   Rancour, Jeannett Senior, MD  benzonatate (TESSALON) 100 MG capsule Take 1 capsule (100 mg total) by mouth every 8 (eight) hours. 01/29/17   Melene Plan, DO  ibuprofen (ADVIL,MOTRIN) 200 MG tablet Take 200 mg by mouth every 6 (six) hours as needed.    [provider]  Phenyleph-Carbetapentane-GG 10-25-400 MG CAPS Take 1 capsule by mouth every 6 (six) hours as needed. 03/13/16   Felicie Morn, NP    Family History No family history on file.  Social History Social History  Substance Use Topics  . Smoking status: Never Smoker  . Smokeless tobacco: Never Used  . Alcohol use No     Allergies   Penicillins   Review of Systems Review of Systems All systems reviewed and are negative for acute change except as noted in the HPI.   Physical Exam Updated Vital Signs BP (!) 157/91 (BP Location: Left Arm)   Pulse 94   Temp 99.3 F (37.4 C) (Oral)   Resp (!) 22   Ht 5\' 1"  (1.549 m)   Wt 203 lb (92.1 kg)   SpO2 98%   BMI 38.36 kg/m   Physical Exam  Constitutional: She appears well-developed and well-nourished. No distress.  HENT:  Head: Normocephalic and atraumatic.  Eyes: Conjunctivae are normal.  Cardiovascular: Normal rate, regular rhythm, normal heart sounds and intact distal pulses.  Exam reveals  no gallop and no friction rub.   No murmur heard. Pulmonary/Chest: Effort normal and breath sounds normal. She has no wheezes. She has no rales. She exhibits no tenderness.  Abdominal: She exhibits no distension.  RLQ tenderness without rebound or guarding, tenderness in the right paralumbar and CVA area.  Musculoskeletal:  Significant pain with lateral compression placed on the right hip. Pain with hip flexion. 2+ DP pulse, normal sensation, 5/5 strength in RLE.   Neurological: She is alert.  Skin: Skin is warm and dry.  Psychiatric: She has a normal mood and affect.  Nursing note and vitals  reviewed.    ED Treatments / Results  DIAGNOSTIC STUDIES:  Oxygen Saturation is 98% on RA, normal by my interpretation.    COORDINATION OF CARE:  5:29 PM Discussed treatment plan with pt at bedside and pt agreed to plan.   Labs (all labs ordered are listed, but only abnormal results are displayed) Labs Reviewed  CBC WITH DIFFERENTIAL/PLATELET - Abnormal; Notable for the following:       Result Value   WBC 10.8 (*)    All other components within normal limits  URINALYSIS, ROUTINE W REFLEX MICROSCOPIC  COMPREHENSIVE METABOLIC PANEL    EKG  EKG Interpretation None       Radiology Ct Abdomen Pelvis W Contrast  Result Date: 05/13/2017 CLINICAL DATA:  Underlying low abdominal pain for 1 month. Pain is worse on the right and exacerbated by any lying down and moving right leg. Previous hysterectomy. EXAM: CT ABDOMEN AND PELVIS WITH CONTRAST TECHNIQUE: Multidetector CT imaging of the abdomen and pelvis was performed using the standard protocol following bolus administration of intravenous contrast. CONTRAST:  100mL ISOVUE-300 IOPAMIDOL (ISOVUE-300) INJECTION 61% COMPARISON:  Noncontrast CT 05/08/2011. FINDINGS: Lower chest: Clear lung bases. No significant pleural or pericardial effusion. Hepatobiliary: The liver is normal in density without focal abnormality. No evidence of gallstones, gallbladder wall thickening or biliary dilatation. Pancreas: Unremarkable. No pancreatic ductal dilatation or surrounding inflammatory changes. Spleen: Normal in size without focal abnormality. Small splenules are stable. Adrenals/Urinary Tract: Both adrenal glands appear normal. The kidneys appear normal without evidence of urinary tract calculus, suspicious lesion or hydronephrosis. No bladder abnormalities are seen. Stomach/Bowel: No evidence of bowel wall thickening, distention or surrounding inflammatory change. There are diverticular changes throughout the colon, greatest distally. The appendix appears  normal. Vascular/Lymphatic: There are no enlarged abdominal or pelvic lymph nodes. No significant vascular findings. Reproductive: Hysterectomy.  No evidence of adnexal mass. Other: No evidence of abdominal wall mass or hernia. No ascites. Musculoskeletal: No acute osseous findings. There are chronic bilateral L5 pars defects with a resulting grade 1 anterolisthesis and biforaminal stenosis at L5-S1. IMPRESSION: 1. No acute findings or explanation for the patient's symptoms. 2. Colonic diverticulosis without evidence of acute inflammation. 3. Bilateral L5 pars defects with grade 1 anterolisthesis and biforaminal stenosis at L5-S1. This may be symptomatic on the basis of bilateral L5 nerve root encroachment. Electronically Signed   By: Carey BullocksWilliam  Veazey M.D.   On: 05/13/2017 20:12    Procedures Procedures (including critical care time)  Medications Ordered in ED Medications - No data to display   Initial Impression / Assessment and Plan / ED Course  I have reviewed the triage vital signs and the nursing notes.  Pertinent labs & imaging results that were available during my care of the patient were reviewed by me and considered in my medical decision making (see chart for details).     Patient is an  obese 46 year old female with a significant past medical history of abdominal hysterectomy and bilateral for reck to me presenting with a one-month history of constant right lower quadrant right flank and right hip pain. Patient states prior to one month ago back in March she started having this similar pain but it was intermittent at that time. She states she was seat evaluated for this and was told everything was normal however symptoms have gradually worsened. Laying down and laying on the right side seems to make the pain worse but it is also tender with walking. She will occasionally get pain that shoots all the way down her leg. On exam patient has significant pain with flexing at the hip and ranging  the hip. She also has significant pain with lateral compression over the right hip concerning for a possible bursitis. Also possibility for musculoskeletal pain or radiculopathy from disc disease in the back. Patient does have right lower quadrant pain which is not concerning for appendicitis due to the length of time she's had symptoms however concerning for possible mass or other causes patient does have a prior history of cervical cancer. This was diagnosed 8 years ago which is why she will underwent an abdominal hysterectomy at the time she had a colonoscopy that was normal. She has noted some loose stools in the last 2 weeks. She denies pain worsening with eating and denies any vomiting. She has tried ibuprofen and Aleve with only minimal improvement of her pain.  UA without acute findings and she denies any urinary symptoms. Low suspicion for kidney stone at this time. Because patient has had an oophorectomy pain cannot be from torsion ovarian cysts. Will do a CBC, CMP and CT to further evaluate and rule out mass or renal complication. Suspicion is that it is musculoskeletal in nature.  9:33 PM CT negative for any abdominal pathology however L5 nerve root encroachment and pars defect. Also still the possibility of bursitis of the hip. Discussed findings with the patient. Labs within normal limits. Will give 5 days of prednisone muscle relaxer and continue Aleve. She was given follow-up.  Final Clinical Impressions(s) / ED Diagnoses   Final diagnoses:  Subacute right lumbar radiculopathy    New Prescriptions Discharge Medication List as of 05/13/2017  8:55 PM    START taking these medications   Details  cyclobenzaprine (FLEXERIL) 10 MG tablet Take 1 tablet (10 mg total) by mouth 2 (two) times daily as needed for muscle spasms., Starting Sun 05/13/2017, Print    predniSONE (DELTASONE) 20 MG tablet Take 2 tablets (40 mg total) by mouth daily., Starting Sun 05/13/2017, Print       I personally  performed the services described in this documentation, which was scribed in my presence.  The recorded information has been reviewed and considered.     [name], MD 05/13/17 2134

## 2018-02-18 ENCOUNTER — Encounter (HOSPITAL_BASED_OUTPATIENT_CLINIC_OR_DEPARTMENT_OTHER): Payer: Self-pay | Admitting: Emergency Medicine

## 2018-02-18 ENCOUNTER — Other Ambulatory Visit: Payer: Self-pay

## 2018-02-18 ENCOUNTER — Emergency Department (HOSPITAL_BASED_OUTPATIENT_CLINIC_OR_DEPARTMENT_OTHER)
Admission: EM | Admit: 2018-02-18 | Discharge: 2018-02-18 | Disposition: A | Discharge status: Home / Self Care | Payer: PRIVATE HEALTH INSURANCE | Attending: Emergency Medicine | Admitting: Emergency Medicine

## 2018-02-18 DIAGNOSIS — S80862A Insect bite (nonvenomous), left lower leg, initial encounter: Secondary | ICD-10-CM | POA: Insufficient documentation

## 2018-02-18 DIAGNOSIS — W57XXXA Bitten or stung by nonvenomous insect and other nonvenomous arthropods, initial encounter: Secondary | ICD-10-CM | POA: Insufficient documentation

## 2018-02-18 DIAGNOSIS — Y999 Unspecified external cause status: Secondary | ICD-10-CM | POA: Insufficient documentation

## 2018-02-18 DIAGNOSIS — Z79899 Other long term (current) drug therapy: Secondary | ICD-10-CM | POA: Insufficient documentation

## 2018-02-18 DIAGNOSIS — Y92008 Other place in unspecified non-institutional (private) residence as the place of occurrence of the external cause: Secondary | ICD-10-CM | POA: Insufficient documentation

## 2018-02-18 DIAGNOSIS — Y9389 Activity, other specified: Secondary | ICD-10-CM | POA: Insufficient documentation

## 2018-02-18 MED ORDER — SULFAMETHOXAZOLE-TRIMETHOPRIM 800-160 MG PO TABS: 1.0000 | ORAL_TABLET | Freq: Two times a day (BID) | ORAL | 0 refills | Status: AC

## 2018-02-18 MED FILL — SULFAMETHOXAZOLE-TMP DS TAB: 800-160 | 7 days supply | Qty: 14 | Fill #0

## 2018-02-18 NOTE — ED Notes (Signed)
Pt verbalized understanding of discharge instructions and denies any further questions at this time.   

## 2018-02-18 NOTE — ED Provider Notes (Signed)
[facility] EMERGENCY DEPARTMENT Provider Note   CSN: [id number] Arrival date & time: 02/18/18  1322     History   Chief Complaint Chief Complaint  Patient presents with  . Insect Bite    HPI Diane Reese is a 47 y.o. female here for evaluation of redness, warmth and pain and itching to the left lateral aspect of lower leg for 3 days. States she was hanging out on her porch over the weekend and fears that she may have been bit by a spider although she does not remember seeing any insects in her porch that day. States that area is itchy and noticed that it was warm today. Redness has gotten worse over the last couple of days. Has been putting a warm compress on the area. No alleviating or aggravating factors. No fevers or chills.  HPI  History reviewed. No pertinent past medical history.  There are no active problems to display for this patient.   Past Surgical History:  Procedure Laterality Date  . ABDOMINAL HYSTERECTOMY      OB History    No data available       Home Medications    Prior to Admission medications   Medication Sig Start Date End Date Taking? Authorizing Provider  amoxicillin-clavulanate (AUGMENTIN) 875-125 MG tablet Take 1 tablet by mouth every 12 (twelve) hours. 01/29/17   Melene PlanFloyd, Dan, DO  azithromycin (ZITHROMAX Z-PAK) 250 MG tablet 2 tablets PO today, then 1 tablet PO daily 08/07/13   Rancour, Jeannett SeniorStephen, MD  benzonatate (TESSALON) 100 MG capsule Take 1 capsule (100 mg total) by mouth every 8 (eight) hours. 01/29/17   Melene PlanFloyd, Dan, DO  cyclobenzaprine (FLEXERIL) 10 MG tablet Take 1 tablet (10 mg total) by mouth 2 (two) times daily as needed for muscle spasms. 05/13/17   Gwyneth SproutPlunkett, Whitney, MD  ibuprofen (ADVIL,MOTRIN) 200 MG tablet Take 200 mg by mouth every 6 (six) hours as needed.    [provider]  Phenyleph-Carbetapentane-GG 10-25-400 MG CAPS Take 1 capsule by mouth every 6 (six) hours as needed. 03/13/16   Felicie MornSmith, David, NP  predniSONE  (DELTASONE) 20 MG tablet Take 2 tablets (40 mg total) by mouth daily. 05/13/17   Gwyneth SproutPlunkett, Whitney, MD  sulfamethoxazole-trimethoprim (BACTRIM DS,SEPTRA DS) 800-160 MG tablet Take 1 tablet by mouth 2 (two) times daily for 7 days. 02/18/18 02/25/18  Liberty HandyGibbons, Alvira Hecht J, PA-C    Family History History reviewed. No pertinent family history.  Social History Social History   Tobacco Use  . Smoking status: Never Smoker  . Smokeless tobacco: Never Used  Substance Use Topics  . Alcohol use: No  . Drug use: No     Allergies   Penicillins   Review of Systems Review of Systems  Skin: Positive for color change.       +pruritus   All other systems reviewed and are negative.    Physical Exam Updated Vital Signs BP (!) 179/108 (BP Location: Left Arm)   Pulse 87   Temp 98.4 F (36.9 C) (Oral)   Resp 16   Ht 5\' 1"  (1.549 m)   Wt 91.2 kg (201 lb)   SpO2 98%   BMI 37.98 kg/m   Physical Exam  Constitutional: She is oriented to person, place, and time. She appears well-developed and well-nourished. No distress.  NAD.  HENT:  Head: Normocephalic and atraumatic.  Right Ear: External ear normal.  Left Ear: External ear normal.  Nose: Nose normal.  Eyes: Conjunctivae and EOM are normal. No scleral  icterus.  Neck: Normal range of motion. Neck supple.  Cardiovascular: Normal rate, regular rhythm and normal heart sounds.  No murmur heard. Pulmonary/Chest: Effort normal and breath sounds normal. She has no wheezes.  Musculoskeletal: Normal range of motion. She exhibits no deformity.  Neurological: She is alert and oriented to person, place, and time.  Skin: Skin is warm and dry. Capillary refill takes less than 2 seconds.  2 x 2 cm area of warmth and tenderness to left lateral aspect of lower leg, two to three punctate lesions centrally.   Psychiatric: She has a normal mood and affect. Her behavior is normal. Judgment and thought content normal.  Nursing note and vitals  reviewed.    ED Treatments / Results  Labs (all labs ordered are listed, but only abnormal results are displayed) Labs Reviewed - No data to display  EKG  EKG Interpretation None       Radiology No results found.  Procedures Procedures (including critical care time)  Medications Ordered in ED Medications - No data to display   Initial Impression / Assessment and Plan / ED Course  I have reviewed the triage vital signs and the nursing notes.  Pertinent labs & imaging results that were available during my care of the patient were reviewed by me and considered in my medical decision making (see chart for details).     History and exam was consistent with local reaction to likely insect bite. There is small area of erythema possible early cellulitis. She has no history of immunosuppression. We will discharge with NSAIDs and hydrocortisone cream, she was given prescription for antibiotic given expansion of redness. Discussed return precautions.  Final Clinical Impressions(s) / ED Diagnoses   Final diagnoses:  Insect bite of left lower leg with local reaction, initial encounter    ED Discharge Orders        Ordered    sulfamethoxazole-trimethoprim (BACTRIM DS,SEPTRA DS) 800-160 MG tablet  2 times daily     02/18/18 1549       Liberty Handy, [place] 02/18/18 1556    Long, Arlyss Repress, MD 02/18/18 2000

## 2018-02-18 NOTE — ED Notes (Signed)
Skin marked with pen.

## 2018-02-18 NOTE — ED Triage Notes (Signed)
Patient reports spider bite to left lower leg.  Reports swelling and erythema.  Denies drainage.

## 2018-02-18 NOTE — Discharge Instructions (Addendum)
Tylenol 1000 mg every 6 hours and topical benadryl or hydrocortisone cream to help with itching, redness and pain. Monitor marked lines, if not improving in 1-2 days start taking antibiotics to prevent infection.

## 2018-04-29 ENCOUNTER — Encounter (HOSPITAL_BASED_OUTPATIENT_CLINIC_OR_DEPARTMENT_OTHER): Payer: Self-pay | Admitting: *Deleted

## 2018-04-29 ENCOUNTER — Other Ambulatory Visit: Payer: Self-pay

## 2018-04-29 ENCOUNTER — Emergency Department (HOSPITAL_BASED_OUTPATIENT_CLINIC_OR_DEPARTMENT_OTHER)
Admission: EM | Admit: 2018-04-29 | Discharge: 2018-04-29 | Disposition: A | Discharge status: Home / Self Care | Payer: Self-pay | Attending: Emergency Medicine | Admitting: Emergency Medicine

## 2018-04-29 DIAGNOSIS — M5441 Lumbago with sciatica, right side: Secondary | ICD-10-CM | POA: Insufficient documentation

## 2018-04-29 DIAGNOSIS — G8929 Other chronic pain: Secondary | ICD-10-CM | POA: Insufficient documentation

## 2018-04-29 MED ORDER — METHOCARBAMOL 500 MG PO TABS: 500.0000 mg | ORAL_TABLET | Freq: Two times a day (BID) | ORAL | 0 refills | Status: DC

## 2018-04-29 MED ORDER — PREDNISONE 10 MG (21) PO TBPK: ORAL_TABLET | ORAL | 0 refills | Status: DC

## 2018-04-29 MED ORDER — KETOROLAC TROMETHAMINE 60 MG/2ML IM SOLN
60.0000 mg | Freq: Once | INTRAMUSCULAR | Status: AC
Start: 2018-04-29 — End: 2018-04-29
  Administered 2018-04-29: 60 mg via INTRAMUSCULAR
  Filled 2018-04-29: qty 2

## 2018-04-29 MED ORDER — LIDOCAINE 5 % EX PTCH: 1.0000 | MEDICATED_PATCH | CUTANEOUS | 0 refills | Status: DC

## 2018-04-29 NOTE — Discharge Instructions (Signed)
Expect your soreness to increase over the next 2-3 days. Take it easy, but do not lay around too much as this may make any stiffness worse.  Antiinflammatory medications: Take 600 mg of ibuprofen every 6 hours or 440 mg (over the counter dose) to 500 mg (prescription dose) of naproxen every 12 hours for the next 3 days. After this time, these medications may be used as needed for pain. Take these medications with food to avoid upset stomach. Choose only one of these medications, do not take them together.  Tylenol: Should you continue to have additional pain while taking the ibuprofen or naproxen, you may add in tylenol as needed. Your daily total maximum amount of tylenol from all sources should be limited to /day for persons without liver problems, or /day for those with liver problems. Muscle relaxer: Robaxin is a muscle relaxer and may help loosen stiff muscles. Do not take the Robaxin while driving or performing other dangerous activities.  Prednisone: Take the prednisone, as prescribed, until finished. Lidocaine patches: These are available via either prescription or over-the-counter. The over-the-counter option may be more economical one and are likely just as effective. There are multiple over-the-counter brands, such as Salonpas. Exercises: Be sure to perform the attached exercises starting with three times a week and working up to performing them daily. This is an essential part of preventing long term problems.   Follow up with a primary care provider for any future management of these complaints.  May also need to follow-up with the orthopedist.

## 2018-04-29 NOTE — ED Notes (Signed)
ED Provider at bedside. 

## 2018-04-29 NOTE — ED Provider Notes (Signed)
[facility] EMERGENCY DEPARTMENT Provider Note   CSN: [id number] Arrival date & time: 04/29/18  1428     History   Chief Complaint Chief Complaint  Patient presents with  . Hip Pain    HPI Diane Reese is a 47 y.o. female.  HPI   Diane Reese is a 47 y.o. female, with a history of lower back and hip pain, presenting to the ED with lower right back pain for the past two months. Pain is aching, 8/10, radiating into right hip and into right leg. Has not taken any medications for her complaint. Denies fever/chills, N/V/D, urinary symptoms, abdominal pain, falls/trauma, neuro deficits, or any other complaints.     History reviewed. No pertinent past medical history.  There are no active problems to display for this patient.   Past Surgical History:  Procedure Laterality Date  . ABDOMINAL HYSTERECTOMY       OB History   None      Home Medications    Prior to Admission medications   Medication Sig Start Date End Date Taking? Authorizing Provider  amoxicillin-clavulanate (AUGMENTIN) 875-125 MG tablet Take 1 tablet by mouth every 12 (twelve) hours. 01/29/17   Melene Plan, DO  azithromycin (ZITHROMAX Z-PAK) 250 MG tablet 2 tablets PO today, then 1 tablet PO daily 08/07/13   Rancour, Jeannett Senior, MD  benzonatate (TESSALON) 100 MG capsule Take 1 capsule (100 mg total) by mouth every 8 (eight) hours. 01/29/17   Melene Plan, DO  cyclobenzaprine (FLEXERIL) 10 MG tablet Take 1 tablet (10 mg total) by mouth 2 (two) times daily as needed for muscle spasms. 05/13/17   [name], MD  ibuprofen (ADVIL,MOTRIN) 200 MG tablet Take 200 mg by mouth every 6 (six) hours as needed.    [provider]  lidocaine (LIDODERM) 5 % Place 1 patch onto the skin daily. Remove & Discard patch within 12 hours or as directed by MD 04/29/18   Joy, Shawn C, PA-C  methocarbamol (ROBAXIN) 500 MG tablet Take 1 tablet (500 mg total) by mouth 2 (two) times daily. 04/29/18   Joy, Shawn C, PA-C    Phenyleph-Carbetapentane-GG 10-25-400 MG CAPS Take 1 capsule by mouth every 6 (six) hours as needed. 03/13/16   Felicie Morn, NP  predniSONE (STERAPRED UNI-PAK 21 TAB) 10 MG (21) TBPK tablet Take 6 tabs ( ) on day 1, 5 tabs ( ) on day 2, 4 tabs ( ) on day 3, 3 tabs ( ) on day 4, 2 tabs ( ) on day 5, and 1 tab ( ) on day 6. 04/29/18   Joy, Hillard Danker, PA-C    Family History History reviewed. No pertinent family history.  Social History Social History   Tobacco Use  . Smoking status: Never Smoker  . Smokeless tobacco: Never Used  Substance Use Topics  . Alcohol use: No  . Drug use: No     Allergies   Penicillins   Review of Systems Review of Systems  Constitutional: Negative for chills and fever.  Respiratory: Negative for shortness of breath.   Cardiovascular: Negative for chest pain.  Gastrointestinal: Negative for abdominal pain, diarrhea, nausea and vomiting.  Genitourinary: Negative for difficulty urinating, dysuria, frequency and hematuria.  Musculoskeletal: Positive for arthralgias and back pain.  Neurological: Negative for weakness and numbness.     Physical Exam Updated Vital Signs BP 125/80 (BP Location: Right Arm)   Pulse 85   Temp 97.7 F (36.5 C) (Oral)   Resp 18   Ht  (1.549 m)  Wt 90.7 kg (200 lb)   SpO2 100%   BMI 37.79 kg/m   Physical Exam  Constitutional: She appears well-developed and well-nourished. No distress.  HENT:  Head: Normocephalic and atraumatic.  Eyes: Conjunctivae are normal.  Neck: Neck supple.  Cardiovascular: Normal rate, regular rhythm and intact distal pulses.  Pulmonary/Chest: Effort normal. No respiratory distress.  Abdominal: Soft. There is no tenderness. There is no guarding.  Musculoskeletal: She exhibits tenderness. She exhibits no edema.       Back:  Range of motion intact in the right hip, though painful. Normal motor function intact in both lower extremities and spine. No midline spinal  tenderness.   Lymphadenopathy:    She has no cervical adenopathy.  Neurological: She is alert.  No noted acute sensory deficits, including no saddle anesthesias. Strength 5/5 with flexion and extension at the bilateral hips, knees, and ankles. Antalgic gait, but ambulates without assistance Coordination intact with heel to shin testing.  Skin: Skin is warm and dry. She is not diaphoretic.  Psychiatric: She has a normal mood and affect. Her behavior is normal.  Nursing note and vitals reviewed.    ED Treatments / Results  Labs (all labs ordered are listed, but only abnormal results are displayed) Labs Reviewed - No data to display  EKG None  Radiology No results found.  Procedures Procedures (including critical care time)  Medications Ordered in ED Medications  ketorolac (TORADOL) injection 60 mg (60 mg Intramuscular Given 04/29/18 1822)     Initial Impression / Assessment and Plan / ED Course  I have reviewed the triage vital signs and the nursing notes.  Pertinent labs & imaging results that were available during my care of the patient were reviewed by me and considered in my medical decision making (see chart for details).     Patient presents with acute on chronic right lower back pain and hip pain.  Neurovascularly intact.  No red flag symptoms.  Follow-up with PCP and orthopedics. The patient was given instructions for home care as well as return precautions. Patient voices understanding of these instructions, accepts the plan, and is comfortable with discharge.  Vitals:   04/29/18 1432 04/29/18 1433 04/29/18 1727  BP: (!) 150/98  125/80  Pulse: 84  85  Resp: 16  18  Temp: 97.7 F (36.5 C)    TempSrc: Oral    SpO2: 97%  100%  Weight:  90.7 kg (200 lb)   Height:   (1.549 m)       Final Clinical Impressions(s) / ED Diagnoses   Final diagnoses:  Chronic right-sided low back pain with right-sided sciatica    ED Discharge Orders        Ordered     methocarbamol (ROBAXIN) 500 MG tablet  2 times daily     04/29/18 1840    predniSONE (STERAPRED UNI-PAK 21 TAB) 10 MG (21) TBPK tablet     04/29/18 1840    lidocaine (LIDODERM) 5 %  Every 24 hours     04/29/18 1840       Anselm Pancoast, PA-C 04/29/18 1840    Shaune Pollack, MD 04/30/18 [phone]

## 2018-04-29 NOTE — ED Triage Notes (Signed)
Pt c/o right hip pain x 2 months

## 2018-11-21 ENCOUNTER — Emergency Department (HOSPITAL_BASED_OUTPATIENT_CLINIC_OR_DEPARTMENT_OTHER)
Admission: EM | Admit: 2018-11-21 | Discharge: 2018-11-21 | Disposition: A | Discharge status: Home / Self Care | Payer: Self-pay | Attending: Emergency Medicine | Admitting: Emergency Medicine

## 2018-11-21 ENCOUNTER — Encounter (HOSPITAL_BASED_OUTPATIENT_CLINIC_OR_DEPARTMENT_OTHER): Payer: Self-pay | Admitting: Emergency Medicine

## 2018-11-21 ENCOUNTER — Other Ambulatory Visit: Payer: Self-pay

## 2018-11-21 DIAGNOSIS — Z79899 Other long term (current) drug therapy: Secondary | ICD-10-CM | POA: Insufficient documentation

## 2018-11-21 DIAGNOSIS — M5432 Sciatica, left side: Secondary | ICD-10-CM

## 2018-11-21 DIAGNOSIS — G8929 Other chronic pain: Secondary | ICD-10-CM | POA: Insufficient documentation

## 2018-11-21 DIAGNOSIS — M5442 Lumbago with sciatica, left side: Secondary | ICD-10-CM | POA: Insufficient documentation

## 2018-11-21 DIAGNOSIS — I10 Essential (primary) hypertension: Secondary | ICD-10-CM | POA: Insufficient documentation

## 2018-11-21 MED ORDER — METHOCARBAMOL 500 MG PO TABS: 500.0000 mg | ORAL_TABLET | Freq: Two times a day (BID) | ORAL | 0 refills | Status: AC

## 2018-11-21 MED ORDER — NAPROXEN 500 MG PO TBEC: 500.0000 mg | DELAYED_RELEASE_TABLET | Freq: Two times a day (BID) | ORAL | 0 refills | Status: AC

## 2018-11-21 MED ORDER — KETOROLAC TROMETHAMINE 30 MG/ML IJ SOLN
30.0000 mg | Freq: Once | INTRAMUSCULAR | Status: AC
  Administered 2018-11-21: 30 mg via INTRAMUSCULAR
  Filled 2018-11-21: qty 1

## 2018-11-21 NOTE — ED Triage Notes (Signed)
Having right hip pain for the past month, pain worse since last week. Denies recent injury

## 2018-11-21 NOTE — ED Provider Notes (Signed)
[facility] EMERGENCY DEPARTMENT Provider Note   CSN: [id number] Arrival date & time: 11/21/18  1759     History   Chief Complaint Chief Complaint  Patient presents with  . Hip Pain    HPI Diane Reese is a 47 y.o. female.  Patient has known degenerative disk disease. Patient takes naproxen (2 pills) daily with some relief.   The history is provided by the patient and the spouse.  Back Pain   This is a chronic (acute) problem. Episode onset: 1 month. The problem occurs constantly (but can be worse at certain times). The problem has been gradually worsening. The pain is associated with no known injury. The pain is present in the lumbar spine. The quality of the pain is described as stabbing. The pain radiates to the left thigh. The pain is at a severity of 7/10. The pain is moderate. The symptoms are aggravated by bending and certain positions. Associated symptoms include tingling. Pertinent negatives include no chest pain, no fever, no numbness, no weight loss, no headaches, no abdominal pain, no abdominal swelling, no bowel incontinence, no perianal numbness, no bladder incontinence, no dysuria, no pelvic pain, no leg pain, no paresthesias, no paresis and no weakness. She has tried NSAIDs for the symptoms. The treatment provided moderate relief. Risk factors include obesity and a sedentary lifestyle.    History reviewed. No pertinent past medical history.  There are no active problems to display for this patient.   Past Surgical History:  Procedure Laterality Date  . ABDOMINAL HYSTERECTOMY       OB History   None      Home Medications    Prior to Admission medications   Medication Sig Start Date End Date Taking? Authorizing Provider  amoxicillin-clavulanate (AUGMENTIN) 875-125 MG tablet Take 1 tablet by mouth every 12 (twelve) hours. 01/29/17   Melene Plan, DO  azithromycin (ZITHROMAX Z-PAK) 250 MG tablet 2 tablets PO today, then 1 tablet PO daily 08/07/13    Rancour, Jeannett Senior, MD  benzonatate (TESSALON) 100 MG capsule Take 1 capsule (100 mg total) by mouth every 8 (eight) hours. 01/29/17   Melene Plan, DO  cyclobenzaprine (FLEXERIL) 10 MG tablet Take 1 tablet (10 mg total) by mouth 2 (two) times daily as needed for muscle spasms. 05/13/17   [name], MD  ibuprofen (ADVIL,MOTRIN) 200 MG tablet Take 200 mg by mouth every 6 (six) hours as needed.    [provider]  lidocaine (LIDODERM) 5 % Place 1 patch onto the skin daily. Remove & Discard patch within 12 hours or as directed by MD 04/29/18   Joy, Shawn C, PA-C  methocarbamol (ROBAXIN) 500 MG tablet Take 1 tablet (500 mg total) by mouth 2 (two) times daily. 04/29/18   Joy, Shawn C, PA-C  Phenyleph-Carbetapentane-GG 10-25-400 MG CAPS Take 1 capsule by mouth every 6 (six) hours as needed. 03/13/16   Felicie Morn, NP  predniSONE (STERAPRED UNI-PAK 21 TAB) 10 MG (21) TBPK tablet Take 6 tabs (60mg ) on day 1, 5 tabs (50mg ) on day 2, 4 tabs (40mg ) on day 3, 3 tabs (30mg ) on day 4, 2 tabs (20mg ) on day 5, and 1 tab (10mg ) on day 6. 04/29/18   Joy, Hillard Danker, PA-C    Family History No family history on file.  Social History Social History   Tobacco Use  . Smoking status: Never Smoker  . Smokeless tobacco: Never Used  Substance Use Topics  . Alcohol use: No  . Drug use: No  Allergies   Penicillins   Review of Systems Review of Systems  Constitutional: Negative for fever and weight loss.  Cardiovascular: Negative for chest pain.  Gastrointestinal: Negative for abdominal pain and bowel incontinence.  Genitourinary: Negative for bladder incontinence, dysuria and pelvic pain.  Musculoskeletal: Positive for back pain.  Neurological: Positive for tingling. Negative for weakness, numbness, headaches and paresthesias.     Physical Exam Updated Vital Signs BP (!) 158/92 (BP Location: Left Arm)   Pulse 98   Temp 98.7 F (37.1 C) (Oral)   Resp 18   Ht 5\' 1"  (1.549 m)   Wt 95.7 kg    SpO2 100%   BMI 39.87 kg/m   Physical Exam  Constitutional: She is oriented to person, place, and time. She appears well-developed and well-nourished.  HENT:  Head: Normocephalic and atraumatic.  Eyes: Conjunctivae are normal. No scleral icterus.  Neck: No JVD present.  Cardiovascular: Normal rate and regular rhythm.  Pulmonary/Chest: Effort normal and breath sounds normal. No respiratory distress.  Abdominal: Soft. Bowel sounds are normal.  Musculoskeletal: Normal range of motion. She exhibits no edema.  5/5 strength lower extremities bilaterally, able to ambulate without difficulty,  Point tenderness over left lower back Neg straight leg raise bilaterally  Neurological: She is alert and oriented to person, place, and time. She exhibits normal muscle tone. Coordination normal.  Skin: Skin is warm and dry. Capillary refill takes less than 2 seconds.     ED Treatments / Results  Labs (all labs ordered are listed, but only abnormal results are displayed) Labs Reviewed - No data to display  EKG None  Radiology No results found.  Procedures Procedures (including critical care time)  Medications Ordered in ED Medications  ketorolac (TORADOL) 30 MG/ML injection 30 mg (has no administration in time range)     Initial Impression / Assessment and Plan / ED Course  I have reviewed the triage vital signs and the nursing notes.  Pertinent labs & imaging results that were available during my care of the patient were reviewed by me and considered in my medical decision making (see chart for details).     Patient presenting with acute on chronic lower back pain with sciatica. There is no known exciting factor. Imaging from 2017 shows extensive degenerative lumbar disc degeneration and nerve root impingment. Worsening pain, is likely due to further degradation of spinal discs.  - Torodal 30 mg IM x1 - Refill home robaxin for 10 days - Refill home naproxen  - Follow up w/ PCP for  chronic back pain mgmt  HTN Elevate BP likely multifactorial, including essential, NSAID use, pain.  Recommend follow up w/ PCP for recheck  Final Clinical Impressions(s) / ED Diagnoses   Final diagnoses:  Sciatica of left side    ED Discharge Orders    None       Garnette Gunnerhompson, Aaron B, MD 11/21/18 1901    Marily MemosMesner, Jason, MD 11/21/18 2149

## 2019-01-13 ENCOUNTER — Emergency Department (HOSPITAL_BASED_OUTPATIENT_CLINIC_OR_DEPARTMENT_OTHER): Payer: PRIVATE HEALTH INSURANCE

## 2019-01-13 ENCOUNTER — Encounter (HOSPITAL_BASED_OUTPATIENT_CLINIC_OR_DEPARTMENT_OTHER): Payer: Self-pay | Admitting: *Deleted

## 2019-01-13 ENCOUNTER — Other Ambulatory Visit: Payer: Self-pay

## 2019-01-13 ENCOUNTER — Emergency Department (HOSPITAL_BASED_OUTPATIENT_CLINIC_OR_DEPARTMENT_OTHER)
Admission: EM | Admit: 2019-01-13 | Discharge: 2019-01-13 | Disposition: A | Discharge status: Home / Self Care | Payer: PRIVATE HEALTH INSURANCE | Attending: Emergency Medicine | Admitting: Emergency Medicine

## 2019-01-13 DIAGNOSIS — J069 Acute upper respiratory infection, unspecified: Secondary | ICD-10-CM | POA: Diagnosis not present

## 2019-01-13 DIAGNOSIS — B9789 Other viral agents as the cause of diseases classified elsewhere: Secondary | ICD-10-CM | POA: Diagnosis not present

## 2019-01-13 DIAGNOSIS — Z79899 Other long term (current) drug therapy: Secondary | ICD-10-CM | POA: Insufficient documentation

## 2019-01-13 DIAGNOSIS — R05 Cough: Secondary | ICD-10-CM | POA: Diagnosis present

## 2019-01-13 NOTE — ED Triage Notes (Signed)
Cough x 3 days

## 2019-01-13 NOTE — ED Provider Notes (Signed)
[facility] EMERGENCY DEPARTMENT Provider Note   CSN: [id number] Arrival date & time: 01/13/19  1951     History   Chief Complaint Chief Complaint  Patient presents with  . Cough    HPI Diane Reese is a 48 y.o. female.  The history is provided by the patient.  Cough  Cough characteristics:  Non-productive Sputum characteristics:  Nondescript Severity:  Mild Onset quality:  Gradual Timing:  Constant Progression:  Unchanged Chronicity:  New Smoker: no   Context: upper respiratory infection   Relieved by:  Nothing Worsened by:  Nothing Associated symptoms: chills, fever and myalgias   Associated symptoms: no chest pain, no ear fullness, no ear pain, no rash, no shortness of breath, no sinus congestion, no sore throat and no wheezing   Risk factors: recent infection     History reviewed. No pertinent past medical history.  There are no active problems to display for this patient.   Past Surgical History:  Procedure Laterality Date  . ABDOMINAL HYSTERECTOMY       OB History   No obstetric history on file.      Home Medications    Prior to Admission medications   Medication Sig Start Date End Date Taking? Authorizing Provider  amoxicillin-clavulanate (AUGMENTIN) 875-125 MG tablet Take 1 tablet by mouth every 12 (twelve) hours. 01/29/17   Melene Plan, DO  azithromycin (ZITHROMAX Z-PAK) 250 MG tablet 2 tablets PO today, then 1 tablet PO daily 08/07/13   Rancour, Jeannett Senior, MD  benzonatate (TESSALON) 100 MG capsule Take 1 capsule (100 mg total) by mouth every 8 (eight) hours. 01/29/17   Melene Plan, DO  cyclobenzaprine (FLEXERIL) 10 MG tablet Take 1 tablet (10 mg total) by mouth 2 (two) times daily as needed for muscle spasms. 05/13/17   [name], MD  ibuprofen (ADVIL,MOTRIN) 200 MG tablet Take 200 mg by mouth every 6 (six) hours as needed.    [provider]  lidocaine (LIDODERM) 5 % Place 1 patch onto the skin daily. Remove & Discard patch  within 12 hours or as directed by MD 04/29/18   Joy, Shawn C, PA-C  Phenyleph-Carbetapentane-GG 10-25-400 MG CAPS Take 1 capsule by mouth every 6 (six) hours as needed. 03/13/16   Felicie Morn, NP  predniSONE (STERAPRED UNI-PAK 21 TAB) 10 MG (21) TBPK tablet Take 6 tabs (60mg ) on day 1, 5 tabs (50mg ) on day 2, 4 tabs (40mg ) on day 3, 3 tabs (30mg ) on day 4, 2 tabs (20mg ) on day 5, and 1 tab (10mg ) on day 6. 04/29/18   Joy, Hillard Danker, PA-C    Family History No family history on file.  Social History Social History   Tobacco Use  . Smoking status: Never Smoker  . Smokeless tobacco: Never Used  Substance Use Topics  . Alcohol use: No  . Drug use: No     Allergies   Penicillins   Review of Systems Review of Systems  Constitutional: Positive for chills and fever.  HENT: Negative for ear pain and sore throat.   Eyes: Negative for pain and visual disturbance.  Respiratory: Positive for cough. Negative for shortness of breath and wheezing.   Cardiovascular: Negative for chest pain and palpitations.  Gastrointestinal: Negative for abdominal pain and vomiting.  Genitourinary: Negative for dysuria and hematuria.  Musculoskeletal: Positive for myalgias. Negative for arthralgias and back pain.  Skin: Negative for color change and rash.  Neurological: Negative for seizures and syncope.  All other systems reviewed and are negative.  Physical Exam Updated Vital Signs BP (!) 146/91 (BP Location: Left Arm)   Pulse 91   Temp 98.7 F (37.1 C) (Oral)   Resp 18   Ht 5\' 1"  (1.549 m)   Wt 99.8 kg   SpO2 99%   BMI 41.57 kg/m   Physical Exam Vitals signs and nursing note reviewed.  Constitutional:      General: She is not in acute distress.    Appearance: She is well-developed.  HENT:     Head: Normocephalic and atraumatic.     Nose: Nose normal.     Mouth/Throat:     Mouth: Mucous membranes are moist.     Pharynx: No oropharyngeal exudate or posterior oropharyngeal erythema.    Eyes:     Extraocular Movements: Extraocular movements intact.     Conjunctiva/sclera: Conjunctivae normal.     Pupils: Pupils are equal, round, and reactive to light.  Neck:     Musculoskeletal: Normal range of motion and neck supple.  Cardiovascular:     Rate and Rhythm: Normal rate and regular rhythm.     Pulses: Normal pulses.     Heart sounds: Normal heart sounds. No murmur.  Pulmonary:     Effort: Pulmonary effort is normal. No respiratory distress.     Breath sounds: Normal breath sounds. No stridor. No wheezing, rhonchi or rales.  Chest:     Chest wall: No tenderness.  Abdominal:     General: There is no distension.     Palpations: Abdomen is soft.     Tenderness: There is no abdominal tenderness.  Skin:    General: Skin is warm and dry.     Capillary Refill: Capillary refill takes less than 2 seconds.  Neurological:     General: No focal deficit present.     Mental Status: She is alert.  Psychiatric:        Mood and Affect: Mood normal.      ED Treatments / Results  Labs (all labs ordered are listed, but only abnormal results are displayed) Labs Reviewed - No data to display  EKG None  Radiology Dg Chest 2 View  Result Date: 01/13/2019 CLINICAL DATA:  48 year old female with cough EXAM: CHEST - 2 VIEW COMPARISON:  03/13/2016 FINDINGS: The heart size and mediastinal contours are within normal limits. Both lungs are clear. The visualized skeletal structures are unremarkable. IMPRESSION: Negative for acute cardiopulmonary disease Electronically Signed   By: Gilmer Mor D.O.   On: 01/13/2019 20:28    Procedures Procedures (including critical care time)  Medications Ordered in ED Medications - No data to display   Initial Impression / Assessment and Plan / ED Course  I have reviewed the triage vital signs and the nursing notes.  Pertinent labs & imaging results that were available during my care of the patient were reviewed by me and considered in my  medical decision making (see chart for details).     Diane Reese is a 48 year old female with no significant medical history who presents to the ED with cough, upper respiratory symptoms.  Patient with cough, congestion for the last 3 days.  With normal vitals.  No fever.  Patient states has had subjective fever at home.  Cough but no sputum production.  Patient has had some body aches.  Patient with clear breath sounds on exam.  Overall no signs of throat infection.  Patient with no chest pain, no shortness of breath.  Chest x-ray showed no signs of pneumonia, pneumothorax, pleural  effusion.  Overall suspect viral process.  Patient is not a smoker.  No history of asthma or COPD.  Recommend continued hydration, Tylenol, Motrin, cough suppressants.  Recommend follow-up with primary care doctor symptoms persist.  Told to return to the ED if symptoms worsen.  This chart was dictated using voice recognition software.  Despite best efforts to proofread,  errors can occur which can change the documentation meaning.    Final Clinical Impressions(s) / ED Diagnoses   Final diagnoses:  Viral URI with cough    ED Discharge Orders    None       Virgina NorfolkCuratolo, Windie Marasco, DO 01/14/19 0036

## 2019-01-13 NOTE — ED Notes (Signed)
PT states understanding of care given, follow up care, and medication prescribed. PT is ambulated from ED to car with a steady gait.  

## 2019-01-13 NOTE — ED Notes (Signed)
Patient is A&Ox4.  No signs of distress noted.  Please see providers complete history and physical exam.  

## 2019-06-14 ENCOUNTER — Emergency Department (HOSPITAL_BASED_OUTPATIENT_CLINIC_OR_DEPARTMENT_OTHER)
Admission: EM | Admit: 2019-06-14 | Discharge: 2019-06-14 | Disposition: A | Discharge status: Home / Self Care | Payer: PRIVATE HEALTH INSURANCE | Attending: Emergency Medicine | Admitting: Emergency Medicine

## 2019-06-14 ENCOUNTER — Other Ambulatory Visit: Payer: Self-pay

## 2019-06-14 ENCOUNTER — Encounter (HOSPITAL_BASED_OUTPATIENT_CLINIC_OR_DEPARTMENT_OTHER): Payer: Self-pay | Admitting: Emergency Medicine

## 2019-06-14 DIAGNOSIS — M5431 Sciatica, right side: Secondary | ICD-10-CM | POA: Insufficient documentation

## 2019-06-14 MED ORDER — CYCLOBENZAPRINE HCL 10 MG PO TABS: 10.0000 mg | ORAL_TABLET | Freq: Three times a day (TID) | ORAL | 0 refills | Status: DC | PRN

## 2019-06-14 MED ORDER — MELOXICAM 7.5 MG PO TABS: 15.0000 mg | ORAL_TABLET | Freq: Every day | ORAL | 0 refills | Status: AC

## 2019-06-14 NOTE — ED Provider Notes (Signed)
[facility] Provider Note   CSN: [id number] Arrival date & time: 06/14/19  3614     History   Chief Complaint Chief Complaint  Patient presents with  . Hip Pain    HPI Diane Reese is a 48 y.o. female.     48 year old female with history of hysterectomy and sciatica who presents with right hip pain.  Patient reports months of right hip/low back pain that begins at the top of her buttock and goes down her right leg.  Pain has been worse recently.  She denies any recent change in physical activity or injury.  No associated leg weakness, numbness, saddle anesthesia, bowel/bladder incontinence, or fevers.  She has been evaluated for this pain before.  No IV drug use or recent illness.  She has tried Aleve for pain and it has worked before but has not seemed to work recently.  The history is provided by the patient.  Hip Pain    History reviewed. No pertinent past medical history.  There are no active problems to display for this patient.   Past Surgical History:  Procedure Laterality Date  . ABDOMINAL HYSTERECTOMY       OB History   No obstetric history on file.      Home Medications    Prior to Admission medications   Medication Sig Start Date End Date Taking? Authorizing Provider  cyclobenzaprine (FLEXERIL) 10 MG tablet Take 1 tablet (10 mg total) by mouth 3 (three) times daily as needed for muscle spasms. 06/14/19   Little, Wenda Overland, MD  lidocaine (LIDODERM) 5 % Place 1 patch onto the skin daily. Remove & Discard patch within 12 hours or as directed by MD 04/29/18   Joy, Helane Gunther, PA-C  meloxicam (MOBIC) 7.5 MG tablet Take 2 tablets (15 mg total) by mouth daily for 7 days. 06/14/19 06/21/19  Little, Wenda Overland, MD  Phenyleph-Carbetapentane-GG 10-25-400 MG CAPS Take 1 capsule by mouth every 6 (six) hours as needed. 03/13/16   Etta Quill, NP    Family History No family history on file.  Social History Social History   Tobacco  Use  . Smoking status: Never Smoker  . Smokeless tobacco: Never Used  Substance Use Topics  . Alcohol use: No  . Drug use: No     Allergies   Penicillins   Review of Systems Review of Systems All other systems reviewed and are negative except that which was mentioned in HPI   Physical Exam Updated Vital Signs Ht 5\' 1"  (1.549 m)   Wt 95.3 kg   BMI 39.68 kg/m   Physical Exam Vitals signs and nursing note reviewed.  Constitutional:      General: She is not in acute distress.    Appearance: She is well-developed.  HENT:     Head: Normocephalic and atraumatic.  Eyes:     Conjunctiva/sclera: Conjunctivae normal.  Neck:     Musculoskeletal: Neck supple.  Musculoskeletal:     Comments: No midline spinal tenderness, no rash on back  Skin:    General: Skin is warm and dry.  Neurological:     Mental Status: She is alert and oriented to person, place, and time.     Sensory: No sensory deficit.     Deep Tendon Reflexes: Reflexes normal.     Comments: Normal strength and sensation BLE  Psychiatric:        Judgment: Judgment normal.      ED Treatments / Results  Labs (all labs  ordered are listed, but only abnormal results are displayed) Labs Reviewed - No data to display  EKG    Radiology No results found.  Procedures Procedures (including critical care time)  Medications Ordered in ED Medications - No data to display   Initial Impression / Assessment and Plan / ED Course  I have reviewed the triage vital signs and the nursing notes.        Patient demonstrates no lower extremity weakness, saddle anesthesia, bowel or bladder incontinence, or any other neurologic deficits concerning for cauda equina. No fevers or other infectious symptoms to suggest by the patient's back pain is due to an infection. I have reviewed return precautions, including the development of any of these signs or symptoms, and the patient has voiced understanding. I reviewed supportive  care instructions, including NSAIDs, early range of motion exercises, and sports med follow-up if symptoms do not improve for referral to physical therapy. Patient voiced understanding and was discharged in satisfactory condition.  Final Clinical Impressions(s) / ED Diagnoses   Final diagnoses:  Sciatica of right side    ED Discharge Orders         Ordered    meloxicam (MOBIC) 7.5 MG tablet  Daily     06/14/19 1035    cyclobenzaprine (FLEXERIL) 10 MG tablet  3 times daily PRN     06/14/19 1035           Little, Ambrose Finlandachel Morgan, MD 06/14/19 1039

## 2019-06-14 NOTE — ED Triage Notes (Signed)
Pt c/i RT hip/low back pain with radiation down RLE for "months"

## 2020-01-06 ENCOUNTER — Emergency Department (HOSPITAL_BASED_OUTPATIENT_CLINIC_OR_DEPARTMENT_OTHER)
Admission: EM | Admit: 2020-01-06 | Discharge: 2020-01-06 | Disposition: A | Discharge status: Home / Self Care | Payer: Managed Care, Other (non HMO) | Attending: Emergency Medicine | Admitting: Emergency Medicine

## 2020-01-06 ENCOUNTER — Other Ambulatory Visit: Payer: Self-pay

## 2020-01-06 ENCOUNTER — Encounter (HOSPITAL_BASED_OUTPATIENT_CLINIC_OR_DEPARTMENT_OTHER): Payer: Self-pay | Admitting: Emergency Medicine

## 2020-01-06 DIAGNOSIS — M5431 Sciatica, right side: Secondary | ICD-10-CM | POA: Diagnosis not present

## 2020-01-06 DIAGNOSIS — Z6836 Body mass index (BMI) 36.0-36.9, adult: Secondary | ICD-10-CM | POA: Insufficient documentation

## 2020-01-06 DIAGNOSIS — Z79899 Other long term (current) drug therapy: Secondary | ICD-10-CM | POA: Insufficient documentation

## 2020-01-06 DIAGNOSIS — E669 Obesity, unspecified: Secondary | ICD-10-CM | POA: Insufficient documentation

## 2020-01-06 DIAGNOSIS — M79604 Pain in right leg: Secondary | ICD-10-CM | POA: Diagnosis present

## 2020-01-06 MED ORDER — NAPROXEN 500 MG PO TABS: 500.0000 mg | ORAL_TABLET | Freq: Two times a day (BID) | ORAL | 0 refills | Status: DC

## 2020-01-06 MED ORDER — NAPROXEN 250 MG PO TABS
500.0000 mg | ORAL_TABLET | Freq: Once | ORAL | Status: AC
  Administered 2020-01-06: 500 mg via ORAL
  Filled 2020-01-06: qty 2

## 2020-01-06 MED ORDER — CYCLOBENZAPRINE HCL 10 MG PO TABS: 10.0000 mg | ORAL_TABLET | Freq: Every evening | ORAL | 0 refills | Status: DC | PRN

## 2020-01-06 NOTE — ED Triage Notes (Signed)
Pt arrives to ED with c/o pain in right leg X 2 weeks with radiation into foot and hip on left side. Pt states she sits for work and was sitting when she first noticed the pain.

## 2020-01-06 NOTE — ED Provider Notes (Signed)
[facility] EM[facility]ENC[facility] DEPARTMENT Provider Note   CSN: [id number] Arrival date & time: 01/06/20  1804     History Chief Complaint  Patient presents with  . Leg Pain    Diane Reese is a 49 y.o. female with history of sciatica who presents with right leg pain.  Patient reports pain in her right leg for approximately 2 weeks.  When it first started it was radiating from her right hip down to her foot.  Over the past couple of days it is isolated to the mid shin to big toe.  She reports associated numbness and tingling.  She is able to walk but having some difficulty walking due to pain.  She does have sciatica but states symptoms are a little bit different.  She denies having any back pain.  She is having pain over her hip when she lies on that side.  Today when she got up from sitting at work she had pain in her leg and got very lightheaded.  She started googling her symptoms and decided to come to the ED after this.  She does not have a PCP has not seen orthopedics for sciatica before.  She is not taking any medicines over-the-counter.  HPI     History reviewed. No pertinent past medical history.  There are no problems to display for this patient.   Past Surgical History:  Procedure Laterality Date  . ABDOMINAL HYSTERECTOMY       OB History   No obstetric history on file.     No family history on file.  Social History   Tobacco Use  . Smoking status: Never Smoker  . Smokeless tobacco: Never Used  Substance Use Topics  . Alcohol use: No  . Drug use: No    Home Medications Prior to Admission medications   Medication Sig Start Date End Date Taking? Authorizing Provider  cyclobenzaprine (FLEXERIL) 10 MG tablet Take 1 tablet (10 mg total) by mouth 3 (three) times daily as needed for muscle spasms. 06/14/19   Little, Ambrose Finland, MD  lidocaine (LIDODERM) 5 % Place 1 patch onto the skin daily. Remove & Discard patch within 12 hours or as directed by MD 04/29/18    Joy, Shawn C, PA-C  Phenyleph-Carbetapentane-GG 10-25-400 MG CAPS Take 1 capsule by mouth every 6 (six) hours as needed. 03/13/16   Felicie Morn, NP    Allergies    Penicillins  Review of Systems   Review of Systems  Cardiovascular: Negative for leg swelling.  Musculoskeletal: Positive for arthralgias and myalgias. Negative for back pain and joint swelling.  Neurological: Positive for light-headedness and numbness. Negative for weakness.    Physical Exam Updated Vital Signs BP (!) 149/100 (BP Location: Right Arm)   Pulse 96   Temp 98.6 F (37 C) (Oral)   Resp 18   Ht 5\' 1"  (1.549 m)   Wt 95.3 kg   SpO2 98%   BMI 39.68 kg/m   Physical Exam Vitals and nursing note reviewed.  Constitutional:      General: She is not in acute distress.    Appearance: She is well-developed. She is obese. She is not ill-appearing.     Comments: Calm and cooperative  HENT:     Head: Normocephalic and atraumatic.  Eyes:     General: No scleral icterus.       Right eye: No discharge.        Left eye: No discharge.     Conjunctiva/sclera: Conjunctivae normal.  Pupils: Pupils are equal, round, and reactive to light.  Cardiovascular:     Rate and Rhythm: Normal rate and regular rhythm.  Pulmonary:     Effort: Pulmonary effort is normal. No respiratory distress.     Breath sounds: Normal breath sounds.  Abdominal:     General: There is no distension.  Musculoskeletal:     Cervical back: Normal range of motion.     Comments: Back: Inspection: No masses, deformity, or rash Palpation: No midline spinal tenderness. No paraspinal muscle tenderness. Tenderness over the lateral hip. Strength: 5/5 in lower extremities and normal plantar and dorsiflexion Sensation: Intact sensation with light touch in lower extremities bilaterally Reflexes: Patellar reflex is 2+ bilaterally SLR: Negative seated straight leg raise Gait: Normal gait  RLE: No swelling, ecchymosis. No focal tenderness. No palpable  cords. 2+DP pulse. Negative Homans sign  Skin:    General: Skin is warm and dry.  Neurological:     Mental Status: She is alert and oriented to person, place, and time.  Psychiatric:        Behavior: Behavior normal.     ED Results / Procedures / Treatments   Labs (all labs ordered are listed, but only abnormal results are displayed) Labs Reviewed - No data to display  EKG None  Radiology No results found.  Procedures Procedures (including critical care time)  Medications Ordered in ED Medications - No data to display  ED Course  I have reviewed the triage vital signs and the nursing notes.  Pertinent labs & imaging results that were available during my care of the patient were reviewed by me and considered in my medical decision making (see chart for details).  49 year old female with atraumatic RLE pain which has been ongoing for 2 weeks and episode of lightheadedness today. She is mildly hypertensive but otherwise vitals are normal. Pt has no back tenderness but does have some lateral hip tenderness. Symptoms are likely multifactorial from nerve impingement and hip bursitis. Low suspicion for DVT. Lightheadedness sounds orthostatic or vasovagal from pain. She was offered blood work but is declining stating she would prefer discharge. Will do trial of NSAIDs and muscle relaxer. I encouraged her to f/u with sports medicine for her symptoms.   MDM Rules/Calculators/A&P                       Final Clinical Impression(s) / ED Diagnoses Final diagnoses:  Right leg pain  Right sided sciatica    Rx / DC Orders ED Discharge Orders    None       Recardo Evangelist, PA-C 01/06/20 2151    Davonna Belling, MD 01/06/20 2326

## 2020-01-06 NOTE — Discharge Instructions (Addendum)
Take Naproxen twice daily for one week. Take with food Take flexeril as needed for muscle pain or spasms Avoid lying on the left side Please follow up with Dr. Jordan Likes with sports medicine

## 2020-10-15 ENCOUNTER — Emergency Department (HOSPITAL_BASED_OUTPATIENT_CLINIC_OR_DEPARTMENT_OTHER)
Admission: EM | Admit: 2020-10-15 | Discharge: 2020-10-15 | Disposition: A | Discharge status: Home / Self Care | Payer: Managed Care, Other (non HMO) | Attending: Emergency Medicine | Admitting: Emergency Medicine

## 2020-10-15 ENCOUNTER — Emergency Department (HOSPITAL_BASED_OUTPATIENT_CLINIC_OR_DEPARTMENT_OTHER): Payer: Managed Care, Other (non HMO)

## 2020-10-15 ENCOUNTER — Other Ambulatory Visit: Payer: Self-pay

## 2020-10-15 ENCOUNTER — Encounter (HOSPITAL_BASED_OUTPATIENT_CLINIC_OR_DEPARTMENT_OTHER): Payer: Self-pay | Admitting: *Deleted

## 2020-10-15 DIAGNOSIS — R1031 Right lower quadrant pain: Secondary | ICD-10-CM | POA: Diagnosis present

## 2020-10-15 DIAGNOSIS — R300 Dysuria: Secondary | ICD-10-CM | POA: Insufficient documentation

## 2020-10-15 DIAGNOSIS — R102 Pelvic and perineal pain: Secondary | ICD-10-CM

## 2020-10-15 LAB — COMPREHENSIVE METABOLIC PANEL
ALT: 15 U/L (ref 0–44)
AST: 20 U/L (ref 15–41)
Albumin: 3.6 g/dL (ref 3.5–5.0)
Alkaline Phosphatase: 82 U/L (ref 38–126)
Anion gap: 11 (ref 5–15)
BUN: 13 mg/dL (ref 6–20)
CO2: 27 mmol/L (ref 22–32)
Calcium: 8.6 mg/dL — ABNORMAL LOW (ref 8.9–10.3)
Chloride: 105 mmol/L (ref 98–111)
Creatinine, Ser: 0.95 mg/dL (ref 0.44–1.00)
GFR, Estimated: >60 mL/min (ref >60)
Glucose, Bld: 86 mg/dL (ref 70–99)
Potassium: 3.2 mmol/L — ABNORMAL LOW (ref 3.5–5.1)
Sodium: 143 mmol/L (ref 135–145)
Total Bilirubin: 0.2 mg/dL — ABNORMAL LOW (ref 0.3–1.2)
Total Protein: 7.1 g/dL (ref 6.5–8.1)

## 2020-10-15 LAB — CBC WITH DIFFERENTIAL/PLATELET
Abs Immature Granulocytes: 0.03 10*3/uL (ref 0.00–0.07)
Basophils Absolute: 0 10*3/uL (ref 0.0–0.1)
Basophils Relative: 0 %
Eosinophils Absolute: 0.2 10*3/uL (ref 0.0–0.5)
Eosinophils Relative: 2 %
HCT: 41.7 % (ref 36.0–46.0)
Hemoglobin: 13.2 g/dL (ref 12.0–15.0)
Immature Granulocytes: 0 %
Lymphocytes Relative: 19 %
Lymphs Abs: 2 10*3/uL (ref 0.7–4.0)
MCH: 27.6 pg (ref 26.0–34.0)
MCHC: 31.7 g/dL (ref 30.0–36.0)
MCV: 87.1 fL (ref 80.0–100.0)
Monocytes Absolute: 0.5 10*3/uL (ref 0.1–1.0)
Monocytes Relative: 5 %
Neutro Abs: 7.7 10*3/uL (ref 1.7–7.7)
Neutrophils Relative %: 74 %
Platelets: 324 10*3/uL (ref 150–400)
RBC: 4.79 MIL/uL (ref 3.87–5.11)
RDW: 12.7 % (ref 11.5–15.5)
WBC: 10.5 10*3/uL (ref 4.0–10.5)
nRBC: 0 % (ref 0.0–0.2)

## 2020-10-15 LAB — URINALYSIS, ROUTINE W REFLEX MICROSCOPIC
Bilirubin Urine: NEGATIVE
Glucose, UA: NEGATIVE mg/dL
Hgb urine dipstick: NEGATIVE
Ketones, ur: NEGATIVE mg/dL
Leukocytes,Ua: NEGATIVE
Nitrite: NEGATIVE
Protein, ur: NEGATIVE mg/dL
Specific Gravity, Urine: 1.02 (ref 1.005–1.030)
pH: 6.5 (ref 5.0–8.0)

## 2020-10-15 LAB — WET PREP, GENITAL
Clue Cells Wet Prep HPF POC: NONE SEEN
Sperm: NONE SEEN
Trich, Wet Prep: NONE SEEN
Yeast Wet Prep HPF POC: NONE SEEN

## 2020-10-15 NOTE — ED Triage Notes (Signed)
Abdominal pain for a week. Vaginal discharge with odor.

## 2020-10-15 NOTE — ED Provider Notes (Signed)
[facility] EMERGENCY DEPARTMENT Provider Note   CSN: [id number] Arrival date & time: 10/15/20  1510     History Chief Complaint  Patient presents with  . Vaginal Discharge    Diane Reese is a 49 y.o. female.  HPI   Patient with significant medical history of abdominal hysterectomy presents to the emergency department with chief complaint of right lower abdominal pain and vaginal discharge.  Patient states this started on Monday and has increasing gotten worse.  She states she noticed a foul vaginal odor and a clear discharge especially when she wipes.  She endorses dysuria but denies hematuria, vaginal bleeding, lower back pain, nausea, vomiting, diarrhea.  Patient states she is sexually active with her husband and does not remember last time she had an STD check.  She does endorse that last time she felt pain like this she had a large ovarian cyst, she is unsure if she had a full hysterectomy or a partial hysterectomy.  She denies any other abdominal surgery, denies history of appendicitis, hepatic or biliary abnormalities, pancreatitis, diverticulitis.  She denies any alleviating or having factors at this time.  Patient denies headache, fever, chills, short of breath, chest pain, dumping, nausea vomiting, diarrhea.  History reviewed. No pertinent past medical history.  There are no problems to display for this patient.   Past Surgical History:  Procedure Laterality Date  . ABDOMINAL HYSTERECTOMY       OB History   No obstetric history on file.     No family history on file.  Social History   Tobacco Use  . Smoking status: Never Smoker  . Smokeless tobacco: Never Used  Substance Use Topics  . Alcohol use: No  . Drug use: No    Home Medications Prior to Admission medications   Medication Sig Start Date End Date Taking? Authorizing Provider  cyclobenzaprine (FLEXERIL) 10 MG tablet Take 1 tablet (10 mg total) by mouth at bedtime and may repeat dose one  time if needed. 01/06/20   Bethel BornGekas, Kelly Marie, PA-C  lidocaine (LIDODERM) 5 % Place 1 patch onto the skin daily. Remove & Discard patch within 12 hours or as directed by MD 04/29/18   Joy, Shawn C, PA-C  naproxen (NAPROSYN) 500 MG tablet Take 1 tablet (500 mg total) by mouth 2 (two) times daily. 01/06/20   Bethel BornGekas, Kelly Marie, PA-C  Phenyleph-Carbetapentane-GG 10-25-400 MG CAPS Take 1 capsule by mouth every 6 (six) hours as needed. 03/13/16   Felicie MornSmith, David, NP    Allergies    Penicillins  Review of Systems   Review of Systems  Constitutional: Negative for chills and fever.  HENT: Negative for congestion, tinnitus, trouble swallowing and voice change.   Respiratory: Negative for cough and shortness of breath.   Cardiovascular: Negative for chest pain and palpitations.  Gastrointestinal: Positive for abdominal pain. Negative for diarrhea, nausea and vomiting.  Genitourinary: Positive for dysuria and vaginal discharge. Negative for difficulty urinating, dyspareunia, enuresis, flank pain, hematuria, vaginal bleeding and vaginal pain.  Musculoskeletal: Negative for back pain.  Skin: Negative for rash.  Neurological: Negative for dizziness and headaches.  Hematological: Does not bruise/bleed easily.    Physical Exam Updated Vital Signs BP (!) 145/86   Pulse 83   Temp 98 F (36.7 C)   Resp 18   Ht 5\' 1"  (1.549 m)   Wt 101 kg   SpO2 100%   BMI 42.08 kg/m   Physical Exam Vitals and nursing note reviewed. Exam conducted with a  chaperone present.  Constitutional:      General: She is not in acute distress.    Appearance: Normal appearance. She is not ill-appearing.  HENT:     Head: Normocephalic and atraumatic.     Nose: No congestion or rhinorrhea.     Mouth/Throat:     Mouth: Mucous membranes are moist.     Pharynx: Oropharynx is clear. No oropharyngeal exudate or posterior oropharyngeal erythema.  Eyes:     General: No scleral icterus.       Right eye: No discharge.        Left  eye: No discharge.  Cardiovascular:     Rate and Rhythm: Normal rate and regular rhythm.     Pulses: Normal pulses.     Heart sounds: No murmur heard.  No friction rub. No gallop.   Pulmonary:     Effort: No respiratory distress.     Breath sounds: No stridor. No wheezing, rhonchi or rales.  Abdominal:     General: There is no distension.     Palpations: Abdomen is soft.     Tenderness: There is abdominal tenderness. There is no right CVA tenderness, left CVA tenderness or guarding.     Comments: Abdomen was visualized, it was nondistended, normoactive bowel sounds, dull to percussion.  She had tenderness to palpation in her right lower abdomen, no McBurney point, negative Murphy sign, no acute abdomen on exam.  Genitourinary:    General: Normal vulva.     Vagina: No vaginal discharge.     Comments: Pelvic exam performed, exterior genitalia was examined there was no lesions, rashes or discharge noted.  Vaginal canal was patent, pink, no lesions or trauma noted.  Patient had a hysterectomy no cervix was visualized, she had right adnexal pain.  No chandelier sign.   Musculoskeletal:        General: No swelling or tenderness.     Right lower leg: No edema.     Left lower leg: No edema.  Skin:    General: Skin is warm and dry.     Capillary Refill: Capillary refill takes less than 2 seconds.     Findings: No rash.  Neurological:     Mental Status: She is alert and oriented to person, place, and time.  Psychiatric:        Mood and Affect: Mood normal.     ED Results / Procedures / Treatments   Labs (all labs ordered are listed, but only abnormal results are displayed) Labs Reviewed  WET PREP, GENITAL - Abnormal; Notable for the following components:      Result Value   WBC, Wet Prep HPF POC FEW (*)    All other components within normal limits  COMPREHENSIVE METABOLIC PANEL - Abnormal; Notable for the following components:   Potassium 3.2 (*)    Calcium 8.6 (*)    Total  Bilirubin 0.2 (*)    All other components within normal limits  URINALYSIS, ROUTINE W REFLEX MICROSCOPIC  CBC WITH DIFFERENTIAL/PLATELET  GC/CHLAMYDIA PROBE AMP ([place]) NOT AT [facility]    EKG None  Radiology US PELVIC COMPLETE WITH TRANSVAGINAL  Result Date: 10/15/2020 CLINICAL DATA:  RIGHT LOWER QUADRANT pain for 5 days. Hysterectomy and RIGHT oophorectomy. EXAM: TRANSABDOMINAL AND TRANSVAGINAL ULTRASOUND OF PELVIS TECHNIQUE: Both transabdominal and transvaginal ultrasound examinations of the pelvis were performed. Transabdominal technique was performed for global imaging of the pelvis including uterus, ovaries, adnexal regions, and pelvic cul-de-sac. It was necessary to proceed with endovaginal exam  following the transabdominal exam to visualize the LEFT ovary. Color and duplex Doppler ultrasound was utilized to evaluate blood flow to the ovaries. COMPARISON:  11/16/2014 and CT abdomen and pelvis on 07/14/2019 FINDINGS: Uterus Measurements: Uterus is surgically absent. Endometrium Hysterectomy. Right ovary RIGHT ovary is surgically absent. Left ovary LEFT ovary is not seen on transabdominal or endovaginal images. No adnexal mass. No free pelvic fluid. Other findings No abnormal free fluid. IMPRESSION: Status post hysterectomy and RIGHT oophorectomy. The LEFT ovary is not visible.  No adnexal mass identified. Electronically Signed   By: Norva Pavlov M.D.   On: 10/15/2020 18:45    Procedures Pelvic exam  Date/Time: 10/15/2020 5:40 PM Performed by: Carroll Sage, PA-C Authorized by: Carroll Sage, PA-C  Preparation: Patient was prepped and draped in the usual sterile fashion. Local anesthesia used: no  Anesthesia: Local anesthesia used: no  Sedation: Patient sedated: no  Patient tolerance: patient tolerated the procedure well with no immediate complications    (including critical care time)  Medications Ordered in ED Medications - No data to display  ED Course   I have reviewed the triage vital signs and the nursing notes.  Pertinent labs & imaging results that were available during my care of the patient were reviewed by me and considered in my medical decision making (see chart for details).    MDM Rules/Calculators/A&P                          Patient presents with vaginal odor and right lower abdominal pain.  She is alert, did not appear acute distress, vital signs reassuring.  Will order screening labs, pelvic exam, gonorrhea chlamydia, wet prep and reevaluate.  CBC negative for leukocytosis or signs of anemia.  CMP showing slight hypokalemia of 3.2, no metabolic acidosis, no AKI, no liver abnormalities, no anion gap noted.  UA negative for nitrates, leukocytes, hematuria, wet prep shows few white blood cells.  Due to positive adnexal pain on the right side will proceed with vaginal ultrasound for ovarian torsion rule out.  Ultrasound does not show any acute findings, patient has a complete hysterectomy no ovaries present.  I have low suspicion for UTI or pyelonephritis as patient did not have CVA tenderness, no no nitrates or leukocytes noted on UA.  Low suspicion for STI or PUD as patient had negative chandelier sign, UA and wet prep unremarkable.  Will send for gonorrhea chlamydia but defer treatment at this time.  Low suspicion for ovarian torsion or ectopic pregnancy as patient had a full hysterectomy.  Low suspicion for bowel obstruction, diverticulitis, GI bleed, hepatic or biliary abnormality, pancreatitis, abdominal abscess as patient denies nausea, vomiting, tolerating p.o., vital signs reassuring, no leukocytosis noted CBC, no peritoneal sign noted on exam.  Patient is noted to have multiple CT abdomen/pelvis which were unremarkable previous one was done in March 2021.  No clinical indication for repeat CT abdomen pelvis at this time.  I suspect patient's right lower abdominal pain is acute on chronic.  Will recommend she follows up with GI for  further evaluation management.  Vital signs have remained stable, no indication for hospital admission.  Patient discussed with attending and they agreed with assessment and plan.  Patient given at home care as well strict return precautions.  Patient verbalized that they understood agreed to said plan.   Final Clinical Impression(s) / ED Diagnoses Final diagnoses:  Right lower quadrant abdominal pain    Rx /  DC Orders ED Discharge Orders    None       Barnie Del 10/15/20 1934    Pollyann Savoy, MD 10/15/20 1950

## 2020-10-15 NOTE — Discharge Instructions (Addendum)
Seen here for abdominal pain and vaginal discharge.  Lab work and imaging all looks reassuring.  I recommend continuing with your home medications as prescribed.  Over-the-counter pain medications like ibuprofen or Tylenol every 6 hours as needed.  I given the contact information for a GI specialist please follow-up with them as I feel you need further management.  Come back to the emergency department if you develop chest pain, shortness of breath, severe abdominal pain, uncontrolled nausea, vomiting, diarrhea.

## 2020-10-18 LAB — GC/CHLAMYDIA PROBE AMP (~~LOC~~) NOT AT ARMC
Chlamydia: NEGATIVE
Comment: NEGATIVE
Comment: NORMAL
Neisseria Gonorrhea: NEGATIVE

## 2020-12-24 ENCOUNTER — Other Ambulatory Visit: Payer: Self-pay | Admitting: Physician Assistant

## 2020-12-24 DIAGNOSIS — R1031 Right lower quadrant pain: Secondary | ICD-10-CM

## 2020-12-27 ENCOUNTER — Ambulatory Visit
Admission: RE | Admit: 2020-12-27 | Discharge: 2020-12-27 | Disposition: A | Discharge status: Home / Self Care | Payer: 59 | Source: Ambulatory Visit | Attending: Physician Assistant | Admitting: Physician Assistant

## 2020-12-27 ENCOUNTER — Other Ambulatory Visit: Payer: Self-pay | Admitting: Physician Assistant

## 2020-12-27 DIAGNOSIS — K5900 Constipation, unspecified: Secondary | ICD-10-CM

## 2021-01-11 ENCOUNTER — Inpatient Hospital Stay: Admission: RE | Admit: 2021-01-11 | Payer: Managed Care, Other (non HMO) | Source: Ambulatory Visit

## 2021-07-26 ENCOUNTER — Other Ambulatory Visit: Payer: Self-pay

## 2021-07-26 ENCOUNTER — Emergency Department (HOSPITAL_BASED_OUTPATIENT_CLINIC_OR_DEPARTMENT_OTHER)
Admission: EM | Admit: 2021-07-26 | Discharge: 2021-07-26 | Disposition: A | Discharge status: Home / Self Care | Payer: Self-pay | Attending: Emergency Medicine | Admitting: Emergency Medicine

## 2021-07-26 ENCOUNTER — Encounter (HOSPITAL_BASED_OUTPATIENT_CLINIC_OR_DEPARTMENT_OTHER): Payer: Self-pay

## 2021-07-26 ENCOUNTER — Emergency Department (HOSPITAL_BASED_OUTPATIENT_CLINIC_OR_DEPARTMENT_OTHER): Payer: Self-pay

## 2021-07-26 DIAGNOSIS — R42 Dizziness and giddiness: Secondary | ICD-10-CM | POA: Insufficient documentation

## 2021-07-26 DIAGNOSIS — R519 Headache, unspecified: Secondary | ICD-10-CM | POA: Insufficient documentation

## 2021-07-26 DIAGNOSIS — E119 Type 2 diabetes mellitus without complications: Secondary | ICD-10-CM | POA: Insufficient documentation

## 2021-07-26 DIAGNOSIS — H53122 Transient visual loss, left eye: Secondary | ICD-10-CM | POA: Insufficient documentation

## 2021-07-26 DIAGNOSIS — I10 Essential (primary) hypertension: Secondary | ICD-10-CM | POA: Insufficient documentation

## 2021-07-26 LAB — COMPREHENSIVE METABOLIC PANEL
ALT: 12 U/L (ref 0–44)
AST: 22 U/L (ref 15–41)
Albumin: 3.6 g/dL (ref 3.5–5.0)
Alkaline Phosphatase: 84 U/L (ref 38–126)
Anion gap: 8 (ref 5–15)
BUN: 14 mg/dL (ref 6–20)
CO2: 29 mmol/L (ref 22–32)
Calcium: 8.8 mg/dL — ABNORMAL LOW (ref 8.9–10.3)
Chloride: 102 mmol/L (ref 98–111)
Creatinine, Ser: 0.67 mg/dL (ref 0.44–1.00)
GFR, Estimated: >60 mL/min (ref >60)
Glucose, Bld: 127 mg/dL — ABNORMAL HIGH (ref 70–99)
Potassium: 4.7 mmol/L (ref 3.5–5.1)
Sodium: 139 mmol/L (ref 135–145)
Total Bilirubin: 0.5 mg/dL (ref 0.3–1.2)
Total Protein: 7.6 g/dL (ref 6.5–8.1)

## 2021-07-26 LAB — CBC WITH DIFFERENTIAL/PLATELET
Abs Immature Granulocytes: 0.03 10*3/uL (ref 0.00–0.07)
Basophils Absolute: 0 10*3/uL (ref 0.0–0.1)
Basophils Relative: 0 %
Eosinophils Absolute: 0.2 10*3/uL (ref 0.0–0.5)
Eosinophils Relative: 3 %
HCT: 43.1 % (ref 36.0–46.0)
Hemoglobin: 13.8 g/dL (ref 12.0–15.0)
Immature Granulocytes: 0 %
Lymphocytes Relative: 23 %
Lymphs Abs: 1.9 10*3/uL (ref 0.7–4.0)
MCH: 27.6 pg (ref 26.0–34.0)
MCHC: 32 g/dL (ref 30.0–36.0)
MCV: 86.2 fL (ref 80.0–100.0)
Monocytes Absolute: 0.4 10*3/uL (ref 0.1–1.0)
Monocytes Relative: 5 %
Neutro Abs: 5.5 10*3/uL (ref 1.7–7.7)
Neutrophils Relative %: 69 %
Platelets: 330 10*3/uL (ref 150–400)
RBC: 5 MIL/uL (ref 3.87–5.11)
RDW: 12.2 % (ref 11.5–15.5)
WBC: 8.1 10*3/uL (ref 4.0–10.5)
nRBC: 0 % (ref 0.0–0.2)

## 2021-07-26 LAB — MAGNESIUM: Magnesium: 2 mg/dL (ref 1.7–2.4)

## 2021-07-26 MED ORDER — LACTATED RINGERS IV BOLUS
1000.0000 mL | Freq: Once | INTRAVENOUS | Status: AC
  Administered 2021-07-26: 1000 mL via INTRAVENOUS

## 2021-07-26 MED ORDER — METOCLOPRAMIDE HCL 5 MG/ML IJ SOLN
10.0000 mg | Freq: Once | INTRAMUSCULAR | Status: AC
  Administered 2021-07-26: 10 mg via INTRAVENOUS
  Filled 2021-07-26: qty 2

## 2021-07-26 MED ORDER — DIPHENHYDRAMINE HCL 50 MG/ML IJ SOLN
25.0000 mg | Freq: Once | INTRAMUSCULAR | Status: AC
  Administered 2021-07-26: 25 mg via INTRAVENOUS
  Filled 2021-07-26: qty 1

## 2021-07-26 NOTE — ED Notes (Signed)
Walked around unit with no increase in dizziness. Gait steady. No signs of distress.

## 2021-07-26 NOTE — ED Triage Notes (Signed)
"  Headache and dizziness on and off x 3 days and my BP was 160/110 yesterday" per pt Denies history of htn

## 2021-07-26 NOTE — ED Provider Notes (Signed)
[facility] EMERGENCY DEPARTMENT Provider Note   CSN: [id number] Arrival date & time: 07/26/21  0818     History Chief Complaint  Patient presents with   Dizziness    Diane Reese is a 50 y.o. female.  HPI Patient presents for 3 days of intermittent dizziness, bitemporal headache, and an episode of transient blurry vision in her left eye yesterday.  This transient episode occurred yesterday morning at around 8:15 AM.  It lasted for 30 minutes and her vision has since been normal.  Patient has a medical history notable for mild diabetes.  She denies any other chronic medical conditions.  Currently, patient is experiencing some dizziness at rest and does have a headache present.  She has not taken anything for analgesia today.  She denies any associated nausea.  She has not had any recent infectious symptoms, including no URIs in the past several weeks.  Prior to 3 days ago, she has not had episodes of dizziness in the past.    History reviewed. No pertinent past medical history.  There are no problems to display for this patient.   Past Surgical History:  Procedure Laterality Date   ABDOMINAL HYSTERECTOMY       OB History   No obstetric history on file.     History reviewed. No pertinent family history.  Social History   Tobacco Use   Smoking status: Never   Smokeless tobacco: Never  Substance Use Topics   Alcohol use: No   Drug use: No    Home Medications Prior to Admission medications   Medication Sig Start Date End Date Taking? Authorizing Provider  cyclobenzaprine (FLEXERIL) 10 MG tablet Take 1 tablet (10 mg total) by mouth at bedtime and may repeat dose one time if needed. 01/06/20   Bethel Born, PA-C  lidocaine (LIDODERM) 5 % Place 1 patch onto the skin daily. Remove & Discard patch within 12 hours or as directed by MD 04/29/18   Joy, Shawn C, PA-C  naproxen (NAPROSYN) 500 MG tablet Take 1 tablet (500 mg total) by mouth 2 (two) times daily.  01/06/20   Bethel Born, PA-C  Phenyleph-Carbetapentane-GG 10-25-400 MG CAPS Take 1 capsule by mouth every 6 (six) hours as needed. 03/13/16   Felicie Morn, NP    Allergies    Penicillins  Review of Systems   Review of Systems  Constitutional:  Negative for activity change, appetite change, chills, fatigue and fever.  HENT:  Negative for congestion, ear pain, facial swelling, rhinorrhea, sinus pain, sore throat, tinnitus and trouble swallowing.   Eyes:  Positive for visual disturbance. Negative for pain.  Respiratory:  Negative for cough and shortness of breath.   Cardiovascular:  Negative for chest pain and palpitations.  Gastrointestinal:  Negative for abdominal pain, nausea and vomiting.  Genitourinary:  Negative for dysuria and hematuria.  Musculoskeletal:  Negative for arthralgias, back pain, gait problem, joint swelling, myalgias and neck pain.  Skin:  Negative for color change and rash.  Neurological:  Positive for dizziness and headaches. Negative for tremors, seizures, syncope, facial asymmetry, speech difficulty, weakness, light-headedness and numbness.  Hematological:  Does not bruise/bleed easily.  Psychiatric/Behavioral:  Negative for confusion and decreased concentration.   All other systems reviewed and are negative.  Physical Exam Updated Vital Signs BP (!) 148/97 (BP Location: Right Arm)   Pulse 84   Temp 98 F (36.7 C) (Oral)   Resp 18   Ht 5\' 1"  (1.549 m)   Wt 99.8 kg  SpO2 100%   BMI 41.57 kg/m   Physical Exam Vitals and nursing note reviewed.  Constitutional:      General: She is not in acute distress.    Appearance: Normal appearance. She is well-developed. She is not ill-appearing, toxic-appearing or diaphoretic.  HENT:     Head: Normocephalic and atraumatic.     Right Ear: External ear normal.     Left Ear: External ear normal.     Nose: Nose normal. No congestion or rhinorrhea.     Mouth/Throat:     Mouth: Mucous membranes are moist.      Pharynx: Oropharynx is clear.  Eyes:     General: No visual field deficit.    Extraocular Movements: Extraocular movements intact.     Conjunctiva/sclera: Conjunctivae normal.  Cardiovascular:     Rate and Rhythm: Normal rate and regular rhythm.     Heart sounds: No murmur heard. Pulmonary:     Effort: Pulmonary effort is normal. No respiratory distress.     Breath sounds: Normal breath sounds.  Abdominal:     Palpations: Abdomen is soft.     Tenderness: There is no abdominal tenderness.  Musculoskeletal:        General: No swelling, tenderness or signs of injury.     Cervical back: Normal range of motion and neck supple. No rigidity.  Skin:    General: Skin is warm and dry.     Capillary Refill: Capillary refill takes less than 2 seconds.  Neurological:     General: No focal deficit present.     Mental Status: She is alert and oriented to person, place, and time.     Cranial Nerves: No cranial nerve deficit, dysarthria or facial asymmetry.     Sensory: Sensation is intact. No sensory deficit.     Motor: No weakness, tremor, abnormal muscle tone or pronator drift.     Coordination: Coordination is intact. Romberg sign negative. Coordination normal. Finger-Nose-Finger Test normal.     Gait: Gait is intact. Gait normal.  Psychiatric:        Mood and Affect: Mood normal.        Behavior: Behavior normal.        Thought Content: Thought content normal.        Judgment: Judgment normal.    ED Results / Procedures / Treatments   Labs (all labs ordered are listed, but only abnormal results are displayed) Labs Reviewed  COMPREHENSIVE METABOLIC PANEL - Abnormal; Notable for the following components:      Result Value   Glucose, Bld 127 (*)    Calcium 8.8 (*)    All other components within normal limits  MAGNESIUM  CBC WITH DIFFERENTIAL/PLATELET    EKG None  Radiology CT HEAD WO CONTRAST ( )  Result Date: 07/26/2021 CLINICAL DATA:  Headache, intracranial hemorrhage  suspected; Dizziness, non-specific. Additional history provided: Patient reports headache, dizziness since Friday. EXAM: CT HEAD WITHOUT CONTRAST TECHNIQUE: Contiguous axial images were obtained from the base of the skull through the vertex without intravenous contrast. COMPARISON:  Report from head CT 06/09/2012 (images unavailable). FINDINGS: Brain: Cerebral volume is normal. There is no acute intracranial hemorrhage. No demarcated cortical infarct. No extra-axial fluid collection. No evidence of an intracranial mass. No midline shift. Partially empty sella turcica. Vascular: No hyperdense vessel. Skull: Normal. Negative for fracture or focal lesion. Sinuses/Orbits: Visualized orbits show no acute finding. No significant paranasal sinus disease at the imaged levels. IMPRESSION: No evidence of acute intracranial abnormality. Partially empty  sella turcica. This finding is very commonly incidental, but can be associated with idiopathic intracranial hypertension. Electronically Signed   By: Jackey Loge DO   On: 07/26/2021 09:20    Procedures Procedures   Medications Ordered in ED Medications  lactated ringers bolus 1,000 mL (0 mLs Intravenous Stopped 07/26/21 1226)  metoCLOPramide (REGLAN) injection 10 mg (10 mg Intravenous Given 07/26/21 0921)  diphenhydrAMINE (BENADRYL) injection 25 mg (25 mg Intravenous Given 07/26/21 0920)    ED Course  I have reviewed the triage vital signs and the nursing notes.  Pertinent labs & imaging results that were available during my care of the patient were reviewed by me and considered in my medical decision making (see chart for details).    MDM Rules/Calculators/A&P                           Patient is a 50 year old female who presents for 3 days of intermittent headache and dizziness.  Vital signs on arrival notable for mild hypertension.  Currently, patient does endorse a bitemporal headache and mild dizziness.  She states that of dizziness over the past 3 days has  been worsened with standing up.  Her dizziness has not caused her nausea.  She has been able to go to work.  Additionally, she states that she had a transient episode of blurry vision in her left eye that occurred yesterday morning.  This lasted for 30 minutes and her vision has been normal since.  On exam, patient has no focal neurologic deficits.  Will obtain lab work, noncontrasted CT scan, and treat headache with IV fluids, Reglan, and Benadryl.  Patient's lab work was unremarkable.  CT scan showed partially empty sella turcica, which could be associated with IIH.  On reassessment, patient was sleeping.  On further reassessment, patient stated that her dizziness had resolved and her headache had significantly diminished.  I spoke with the patient regarding further work-up given her transient episode of blurry vision yesterday.  Patient may benefit from TIA work-up, to include MRI.  At this location, MRI is not available and patient would need to be transferred to network hospital for this study.  Patient declined any further work-up today.  She did state that she would be willing to follow-up with neurology for further testing as an outpatient.  Contact information was provided for her to coordinate this follow-up.  She was encouraged to return to the ED for any new additional symptoms.  Patient was discharged in stable condition.   Final Clinical Impression(s) / ED Diagnoses Final diagnoses:  Dizziness  Acute nonintractable headache, unspecified headache type    Rx / DC Orders ED Discharge Orders     None        Gloris Manchester, MD 07/27/21 0501

## 2021-09-01 ENCOUNTER — Encounter: Payer: Self-pay | Admitting: Neurology

## 2021-09-01 ENCOUNTER — Ambulatory Visit (INDEPENDENT_AMBULATORY_CARE_PROVIDER_SITE_OTHER): Payer: Self-pay | Admitting: Neurology

## 2021-09-01 VITALS — BP 166/97 | HR 98 | Ht 61.0 in | Wt 236.0 lb

## 2021-09-01 DIAGNOSIS — G932 Benign intracranial hypertension: Secondary | ICD-10-CM

## 2021-09-01 MED ORDER — ACETAZOLAMIDE ER 500 MG PO CP12: 500.0000 mg | ORAL_CAPSULE | Freq: Two times a day (BID) | ORAL | 6 refills | Status: DC

## 2021-09-01 NOTE — Progress Notes (Signed)
[facility]  PATIENT: Michole Lecuyer DOB: 09/09/1971  REFERRING CLINICIAN: Heron Nay, PA HISTORY FROM: Patient  REASON FOR VISIT: Headaches, dizziness    HISTORICAL  CHIEF COMPLAINT:  Chief Complaint  Patient presents with   New Patient (Initial Visit)    Rm 13, alone, ED fu for poss TIA, states during headaches vision blacked out     HISTORY OF PRESENT ILLNESS:  This is a 50 year old woman with no reported past medical history who is presenting after a recent ED visit for dizziness and headache.  Patient stated she was at work after lifting a box she has sudden onset of head pressure and dizziness.  She later noted that she could not see well out of her left eye.  Took her blood pressure and it was elevated, therefore she presented to the ED for further work-up.  In the ED Noncon head CT was performed which was normal did not show any signs of bleed or stroke but did show a empty sella.  Patient stated that she had suffered from headache throughout her life she was able to manage her headaches but that the episode of headache was different because it was associated with dizziness and left eye blurriness..  Since leaving the hospital she has not had any additional similar episode.  Dizziness is resolved, she still having occasional headaches.  Reported headaches are on top of the head and sharp pain and will radiate to the back.  She denies any nausea or vomiting or photophobia or phonophobia associated with the headaches.  Usually when she has a headache she will take Aleve which seem to provide some relief.  She has not tried any preventive medication in the past and no other medications on the list.    Father with migraines    OTHER MEDICAL CONDITIONS: None reported    REVIEW OF SYSTEMS: Full 14 system review of systems performed and negative with exception of: as noted in the HPI  ALLERGIES: Allergies  Allergen Reactions   Penicillins Hives    HOME  MEDICATIONS: Outpatient Medications Prior to Visit  Medication Sig Dispense Refill   cyclobenzaprine (FLEXERIL) 10 MG tablet Take 1 tablet (10 mg total) by mouth at bedtime and may repeat dose one time if needed. 10 tablet 0   lidocaine (LIDODERM) 5 % Place 1 patch onto the skin daily. Remove & Discard patch within 12 hours or as directed by MD 30 patch 0   naproxen (NAPROSYN) 500 MG tablet Take 1 tablet (500 mg total) by mouth 2 (two) times daily. 30 tablet 0   Phenyleph-Carbetapentane-GG 10-25-400 MG CAPS Take 1 capsule by mouth every 6 (six) hours as needed. 28 capsule 0   No facility-administered medications prior to visit.    PAST MEDICAL HISTORY: History reviewed. No pertinent past medical history.  PAST SURGICAL HISTORY: Past Surgical History:  Procedure Laterality Date   ABDOMINAL HYSTERECTOMY      FAMILY HISTORY: Family History  Problem Relation Age of Onset   Stroke Sister     SOCIAL HISTORY: Social History   Socioeconomic History   Marital status: Married    Spouse name: Not on file   Number of children: Not on file   Years of education: Not on file   Highest education level: Not on file  Occupational History   Not on file  Tobacco Use   Smoking status: Never   Smokeless tobacco: Never  Substance and Sexual Activity   Alcohol use: No  Drug use: No   Sexual activity: Yes    Birth control/protection: Surgical  Other Topics Concern   Not on file  Social History Narrative   Not on file   Social Determinants of Health   Financial Resource Strain: Not on file  Food Insecurity: Not on file  Transportation Needs: Not on file  Physical Activity: Not on file  Stress: Not on file  Social Connections: Not on file  Intimate Partner Violence: Not on file     PHYSICAL EXAM GENERAL EXAM/CONSTITUTIONAL: Vitals:  Vitals:   09/01/21 1550  BP: (!) 166/97  Pulse: 98  Weight: 236 lb (107 kg)  Height: 5\' 1"  (1.549 m)   Body mass index is 44.59 kg/m. Wt  Readings from Last 3 Encounters:  09/01/21 236 lb (107 kg)  07/26/21 220 lb (99.8 kg)  10/15/20 222 lb 11.2 oz (101 kg)   Patient is in no distress; well developed, nourished and groomed; neck is supple, obese woman  CARDIOVASCULAR: Examination of carotid arteries is normal; no carotid bruits Regular rate and rhythm, no murmurs Examination of peripheral vascular system by observation and palpation is normal  EYES: Pupils round and reactive to light, Visual fields full to confrontation, Extraocular movements intacts,   MUSCULOSKELETAL: Gait, strength, tone, movements noted in Neurologic exam below  NEUROLOGIC: MENTAL STATUS:  awake, alert, oriented to person, place and time recent and remote memory intact normal attention and concentration language fluent, comprehension intact, naming intact fund of knowledge appropriate  CRANIAL NERVE:  2nd - no papilledema or hemorrhages on fundoscopic exam 2nd, 3rd, 4th, 6th - pupils equal and reactive to light, visual fields full to confrontation, extraocular muscles intact, no nystagmus 5th - facial sensation symmetric 7th - facial strength symmetric 8th - hearing intact 9th - palate elevates symmetrically, uvula midline 11th - shoulder shrug symmetric 12th - tongue protrusion midline  MOTOR:  normal bulk and tone, full strength in the BUE, BLE  SENSORY:  normal and symmetric to light touch, pinprick, temperature, vibration  COORDINATION:  finger-nose-finger, fine finger movements normal  REFLEXES:  deep tendon reflexes present and symmetric  GAIT/STATION:  normal   DIAGNOSTIC DATA (LABS, IMAGING, TESTING) - I reviewed patient records, labs, notes, testing and imaging myself where available.  Lab Results  Component Value Date   WBC 8.1 07/26/2021   HGB 13.8 07/26/2021   HCT 43.1 07/26/2021   MCV 86.2 07/26/2021   PLT 330 07/26/2021      Component Value Date/Time   NA 139 07/26/2021 0926   K 4.7 07/26/2021 0926    CL 102 07/26/2021 0926   CO2 29 07/26/2021 0926   GLUCOSE 127 (H) 07/26/2021 0926   BUN 14 07/26/2021 0926   CREATININE 0.67 07/26/2021 0926   CALCIUM 8.8 (L) 07/26/2021 0926   PROT 7.6 07/26/2021 0926   ALBUMIN 3.6 07/26/2021 0926   AST 22 07/26/2021 0926   ALT 12 07/26/2021 0926   ALKPHOS 84 07/26/2021 0926   BILITOT 0.5 07/26/2021 0926   GFRNONAA >60 07/26/2021 0926   GFRAA >60 05/13/2017 1755   No results found for: CHOL, HDL, LDLCALC, LDLDIRECT, TRIG, CHOLHDL No results found for: 05/15/2017 No results found for: VITAMINB12 No results found for: TSH  Head CT 07/26/2021 No evidence of acute intracranial abnormality. Partially empty sella turcica. This finding is very commonly incidental, but can be associated with idiopathic intracranial hypertension.    ASSESSMENT AND PLAN  50 y.o. year old female with obesity and no reported past medical  history who is presenting with headaches and dizziness.  On August 9 she had 1 episode of headache associated with dizziness and left eye blurry vision.  Noncon head CT was performed and was negative for any acute intracranial abnormality.  Since discharge from the hospital patient's reported dizziness has resolved, she has not had any additional episodes of blurry vision but continues to have occasional headache usually about once a week sharp pain on top of the head that radiates to the back.  Her Noncon head CT showed an empty sella turcica.  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  I will start her on Diamox 500 mg twice daily.  I will follow-up with her in 6 months but I advised patient to call me if her headaches are not resolved.   1. Pseudotumor cerebri     PLAN: Start with Diamox 500 mg twice a day  Return to clinic in 6 months or sooner if worse  No orders of the defined types were placed in this encounter.   Meds  ordered this encounter  Medications   acetaZOLAMIDE ER (DIAMOX) 500 MG capsule    Sig: Take 1 capsule (500 mg total) by mouth 2 (two) times daily.    Dispense:  60 capsule    Refill:  6    Return in about 6 months (around 03/01/2022).    Windell Norfolk, MD 09/01/2021, 4:25 PM  [facility] [address], [address] [place], [place] [address] [phone]

## 2021-09-01 NOTE — Patient Instructions (Signed)
Start with Diamox 500 mg twice a day  Return to clinic in 6 months or sooner if worse

## 2022-03-01 ENCOUNTER — Ambulatory Visit: Payer: Self-pay | Admitting: Neurology

## 2022-03-02 ENCOUNTER — Encounter: Payer: Self-pay | Admitting: Neurology

## 2022-08-11 IMAGING — CR DG ABDOMEN 1V
1 series · 1 of 1 positions shown · non-contrast
Comparison: None.

CLINICAL DATA: constipation

EXAM:
ABDOMEN - 1 VIEW

[t abdomen supine]
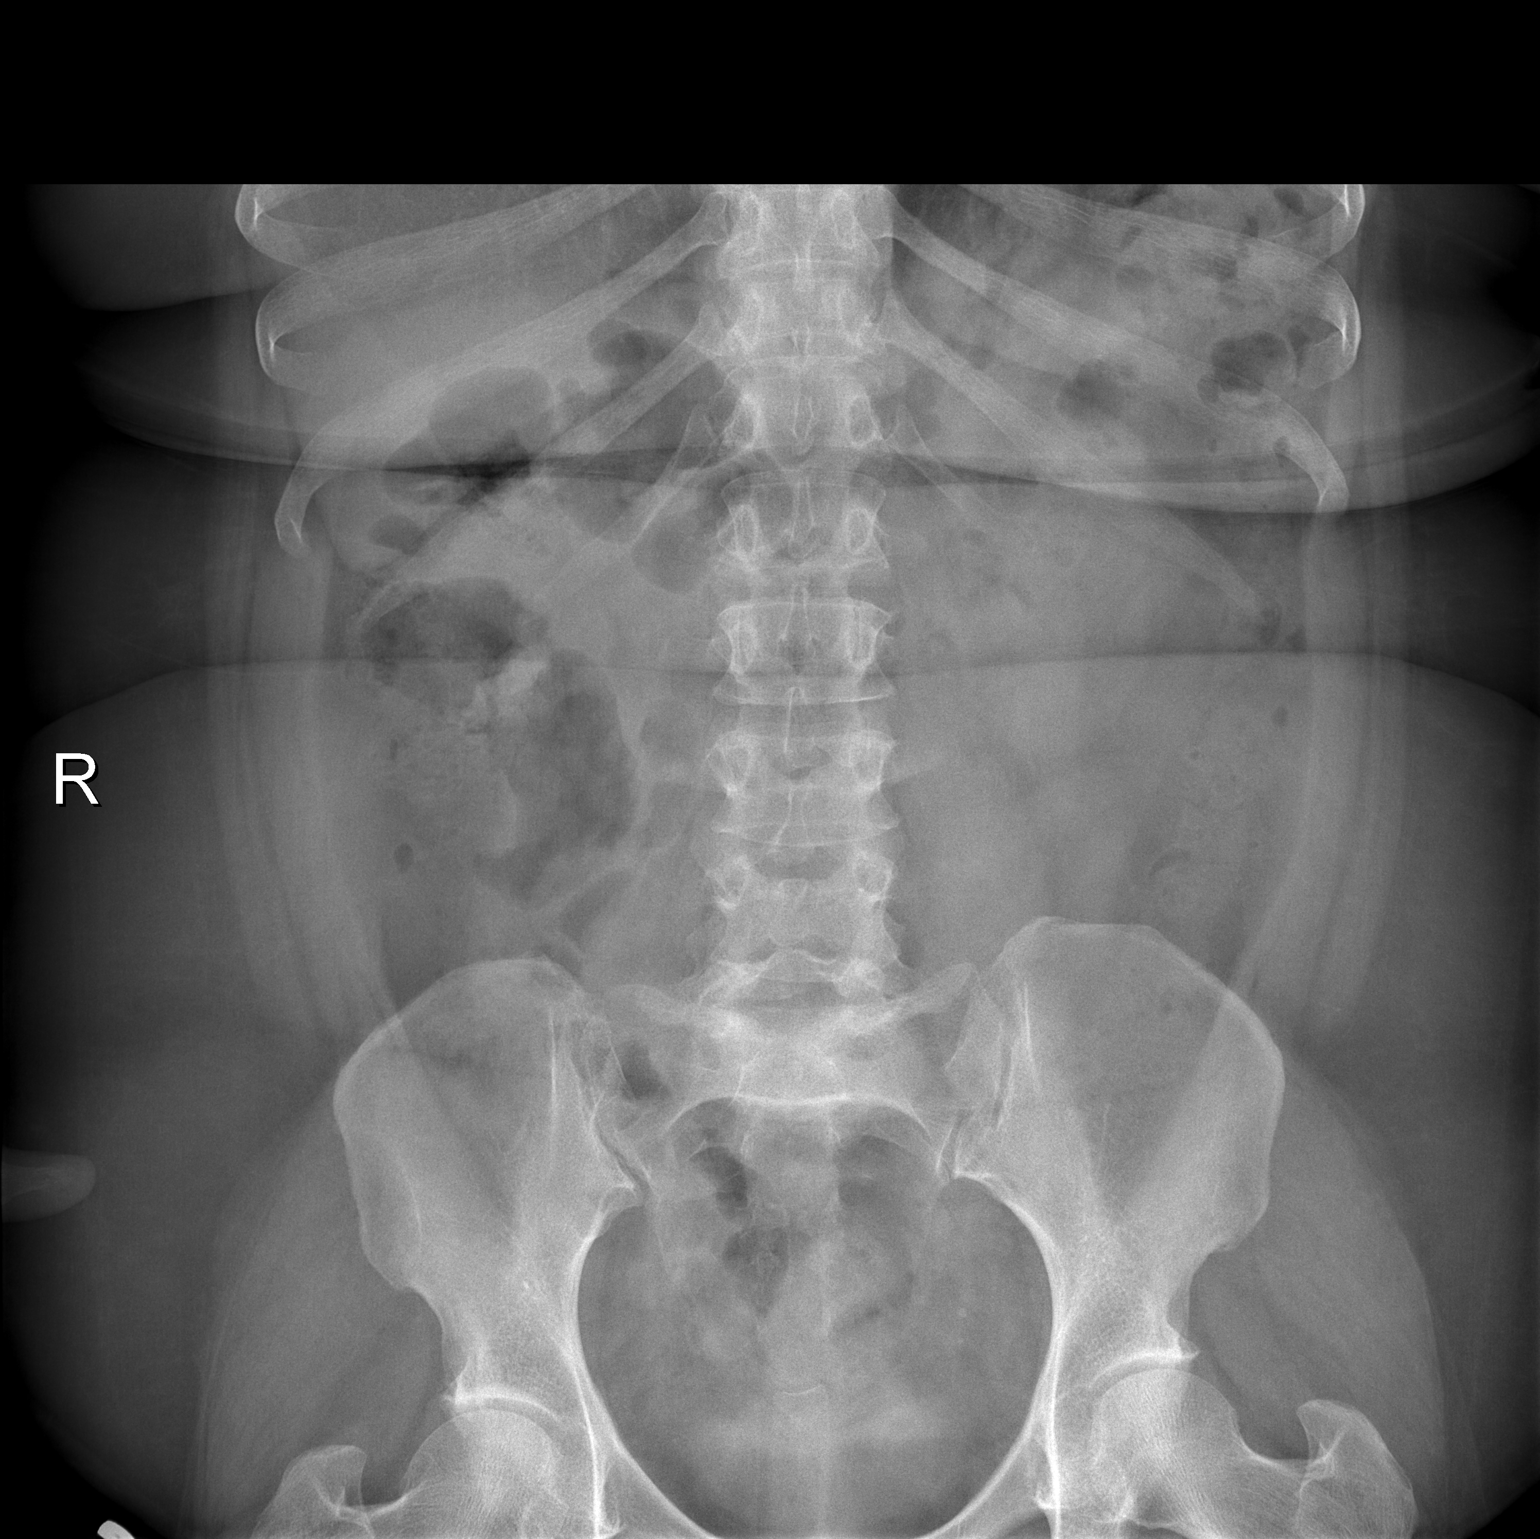

[1 of 1 positions shown; findings below may reference images not displayed]

FINDINGS: The bowel gas pattern is normal. Mild colonic stool burden. No
suspicious radio-opaque calculi or other significant radiographic
abnormality are seen.
IMPRESSION: Nonobstructive bowel gas pattern.  Mild stool burden.

## 2023-02-06 ENCOUNTER — Other Ambulatory Visit (HOSPITAL_BASED_OUTPATIENT_CLINIC_OR_DEPARTMENT_OTHER): Payer: Self-pay

## 2023-02-06 ENCOUNTER — Emergency Department (HOSPITAL_BASED_OUTPATIENT_CLINIC_OR_DEPARTMENT_OTHER)
Admission: EM | Admit: 2023-02-06 | Discharge: 2023-02-06 | Disposition: A | Discharge status: Home / Self Care | Payer: Commercial Managed Care - HMO | Attending: Emergency Medicine | Admitting: Emergency Medicine

## 2023-02-06 ENCOUNTER — Emergency Department (HOSPITAL_BASED_OUTPATIENT_CLINIC_OR_DEPARTMENT_OTHER): Payer: Commercial Managed Care - HMO

## 2023-02-06 ENCOUNTER — Other Ambulatory Visit: Payer: Self-pay

## 2023-02-06 DIAGNOSIS — J209 Acute bronchitis, unspecified: Secondary | ICD-10-CM | POA: Diagnosis not present

## 2023-02-06 DIAGNOSIS — Z1152 Encounter for screening for COVID-19: Secondary | ICD-10-CM | POA: Insufficient documentation

## 2023-02-06 DIAGNOSIS — R059 Cough, unspecified: Secondary | ICD-10-CM | POA: Diagnosis present

## 2023-02-06 DIAGNOSIS — R739 Hyperglycemia, unspecified: Secondary | ICD-10-CM | POA: Diagnosis not present

## 2023-02-06 DIAGNOSIS — E876 Hypokalemia: Secondary | ICD-10-CM | POA: Diagnosis not present

## 2023-02-06 DIAGNOSIS — D72829 Elevated white blood cell count, unspecified: Secondary | ICD-10-CM | POA: Insufficient documentation

## 2023-02-06 LAB — CBC WITH DIFFERENTIAL/PLATELET
Abs Immature Granulocytes: 0.02 10*3/uL (ref 0.00–0.07)
Basophils Absolute: 0 10*3/uL (ref 0.0–0.1)
Basophils Relative: 0 %
Eosinophils Absolute: 0.4 10*3/uL (ref 0.0–0.5)
Eosinophils Relative: 3 %
HCT: 42.5 % (ref 36.0–46.0)
Hemoglobin: 13.5 g/dL (ref 12.0–15.0)
Immature Granulocytes: 0 %
Lymphocytes Relative: 19 %
Lymphs Abs: 2.2 10*3/uL (ref 0.7–4.0)
MCH: 27.4 pg (ref 26.0–34.0)
MCHC: 31.8 g/dL (ref 30.0–36.0)
MCV: 86.4 fL (ref 80.0–100.0)
Monocytes Absolute: 0.5 10*3/uL (ref 0.1–1.0)
Monocytes Relative: 5 %
Neutro Abs: 8.3 10*3/uL — ABNORMAL HIGH (ref 1.7–7.7)
Neutrophils Relative %: 73 %
Platelets: 326 10*3/uL (ref 150–400)
RBC: 4.92 MIL/uL (ref 3.87–5.11)
RDW: 12.3 % (ref 11.5–15.5)
WBC: 11.4 10*3/uL — ABNORMAL HIGH (ref 4.0–10.5)
nRBC: 0 % (ref 0.0–0.2)

## 2023-02-06 LAB — BASIC METABOLIC PANEL
Anion gap: 8 (ref 5–15)
BUN: 15 mg/dL (ref 6–20)
CO2: 27 mmol/L (ref 22–32)
Calcium: 8.4 mg/dL — ABNORMAL LOW (ref 8.9–10.3)
Chloride: 103 mmol/L (ref 98–111)
Creatinine, Ser: 0.86 mg/dL (ref 0.44–1.00)
GFR, Estimated: >60 mL/min (ref >60)
Glucose, Bld: 142 mg/dL — ABNORMAL HIGH (ref 70–99)
Potassium: 3.4 mmol/L — ABNORMAL LOW (ref 3.5–5.1)
Sodium: 138 mmol/L (ref 135–145)

## 2023-02-06 LAB — RESP PANEL BY RT-PCR (RSV, FLU A&B, COVID)  RVPGX2
Influenza A by PCR: NEGATIVE
Influenza B by PCR: NEGATIVE
Resp Syncytial Virus by PCR: NEGATIVE
SARS Coronavirus 2 by RT PCR: NEGATIVE

## 2023-02-06 LAB — TROPONIN I (HIGH SENSITIVITY): Troponin I (High Sensitivity): 2 ng/L (ref <18)

## 2023-02-06 MED ORDER — POTASSIUM CHLORIDE CRYS ER 20 MEQ PO TBCR
20.0000 meq | EXTENDED_RELEASE_TABLET | Freq: Once | ORAL | Status: AC
  Administered 2023-02-06: 20 meq via ORAL
  Filled 2023-02-06: qty 1

## 2023-02-06 MED ORDER — BENZONATATE 100 MG PO CAPS
100.0000 mg | ORAL_CAPSULE | Freq: Three times a day (TID) | ORAL | 0 refills | Status: DC
  Filled 2023-02-06: qty 21, 7d supply, fill #0

## 2023-02-06 MED ORDER — METHYLPREDNISOLONE 4 MG PO TBPK
ORAL_TABLET | ORAL | 0 refills | Status: DC
  Filled 2023-02-06: qty 21, 6d supply, fill #0

## 2023-02-06 MED ORDER — AZITHROMYCIN 250 MG PO TABS
250.0000 mg | ORAL_TABLET | Freq: Every day | ORAL | 0 refills | Status: DC
  Filled 2023-02-06: qty 6, 6d supply, fill #0

## 2023-02-06 NOTE — ED Notes (Signed)
ED Provider at bedside. 

## 2023-02-06 NOTE — ED Triage Notes (Signed)
Pt arrives with c/o productive cough and chest pain that started 4 days ago. Pt denies sick contacts. Pt endorses SOB. Pt denies fevers.

## 2023-02-06 NOTE — Discharge Instructions (Addendum)
Your symptoms could be due to viral bronchitis however given your slightly elevated white blood cell count and productive cough, will treat you for developing bacterial pneumonia with a short course of azithromycin.  Will also discharge you on a steroid taper and provide cough suppressant.

## 2023-02-06 NOTE — ED Provider Notes (Signed)
[facility] Provider Note   CSN: [id number] Arrival date & time: 02/06/23  1532     History  Chief Complaint  Patient presents with   Cough    Diane Reese is a 51 y.o. female.   Cough Associated symptoms: shortness of breath      52 year old female presenting to the emergency department with a chief complaint of productive cough and pleuritic chest discomfort that started roughly 4 days ago.  She denies any sick contacts.  She denies any fevers or chills.  She endorses mild shortness of breath.  She denies any swelling in her lower extremities, denies any hemoptysis, denies any history of PE or DVT, denies any use of estrogen containing birth control.  She states that she is bringing up a small amount of sputum.  She is otherwise tolerating oral intake and well-appearing.  Home Medications Prior to Admission medications   Medication Sig Start Date End Date Taking? Authorizing Provider  azithromycin (ZITHROMAX) 250 MG tablet Take 1 tablet (250 mg total) by mouth daily. Take first 2 tablets together, then 1 every day until finished. 02/06/23  Yes Regan Lemming, MD  benzonatate (TESSALON) 100 MG capsule Take 1 capsule (100 mg total) by mouth every 8 (eight) hours. 02/06/23  Yes Regan Lemming, MD  methylPREDNISolone (MEDROL DOSEPAK) 4 MG TBPK tablet Take as prescribed on the box 02/06/23  Yes Regan Lemming, MD  acetaZOLAMIDE ER (DIAMOX) 500 MG capsule Take 1 capsule (500 mg total) by mouth 2 (two) times daily. 09/01/21 10/01/21  Alric Ran, MD  cyclobenzaprine (FLEXERIL) 10 MG tablet Take 1 tablet (10 mg total) by mouth at bedtime and may repeat dose one time if needed. 01/06/20   Recardo Evangelist, PA-C  lidocaine (LIDODERM) 5 % Place 1 patch onto the skin daily. Remove & Discard patch within 12 hours or as directed by MD 04/29/18   Joy, Shawn C, PA-C  naproxen (NAPROSYN) 500 MG tablet Take 1 tablet (500 mg total) by mouth 2 (two) times  daily. 01/06/20   Recardo Evangelist, PA-C  Phenyleph-Carbetapentane-GG 10-25-400 MG CAPS Take 1 capsule by mouth every 6 (six) hours as needed. 03/13/16   Etta Quill, NP      Allergies    Penicillins    Review of Systems   Review of Systems  Respiratory:  Positive for cough and shortness of breath.   All other systems reviewed and are negative.   Physical Exam Updated Vital Signs BP (!) 162/97 (BP Location: Right Arm)   Pulse 96   Temp 99.7 F (37.6 C) (Oral)   Resp 18   Ht 5' 1"$  (1.549 m)   Wt 101.2 kg   SpO2 95%   BMI 42.14 kg/m  Physical Exam Vitals and nursing note reviewed.  Constitutional:      General: She is not in acute distress.    Appearance: She is well-developed. She is obese.  HENT:     Head: Normocephalic and atraumatic.  Eyes:     Conjunctiva/sclera: Conjunctivae normal.  Cardiovascular:     Rate and Rhythm: Normal rate and regular rhythm.     Heart sounds: No murmur heard. Pulmonary:     Effort: Pulmonary effort is normal. No respiratory distress.     Breath sounds: Normal breath sounds.  Abdominal:     Palpations: Abdomen is soft.     Tenderness: There is no abdominal tenderness.  Musculoskeletal:        General: No swelling.  Cervical back: Neck supple.  Skin:    General: Skin is warm and dry.     Capillary Refill: Capillary refill takes less than 2 seconds.  Neurological:     Mental Status: She is alert.  Psychiatric:        Mood and Affect: Mood normal.     ED Results / Procedures / Treatments   Labs (all labs ordered are listed, but only abnormal results are displayed) Labs Reviewed  CBC WITH DIFFERENTIAL/PLATELET - Abnormal; Notable for the following components:      Result Value   WBC 11.4 (*)    Neutro Abs 8.3 (*)    All other components within normal limits  BASIC METABOLIC PANEL - Abnormal; Notable for the following components:   Potassium 3.4 (*)    Glucose, Bld 142 (*)    Calcium 8.4 (*)    All other components  within normal limits  RESP PANEL BY RT-PCR (RSV, FLU A&B, COVID)  [id number]  TROPONIN I (HIGH SENSITIVITY)    EKG EKG Interpretation  Date/Time:  Tuesday February 06 2023 15:41:51 EST Ventricular Rate:  96 PR Interval:  148 QRS Duration: 84 QT Interval:  350 QTC Calculation: 443 R Axis:   44 Text Interpretation: Sinus rhythm Baseline wander in lead(s) V2 V4 V5 Confirmed by Regan Lemming ([id number]) on 02/06/2023 3:55:34 PM  Radiology DG Chest 2 View  Result Date: 02/06/2023 CLINICAL DATA:  Chest pain. EXAM: CHEST - 2 VIEW COMPARISON:  11/13/2022 FINDINGS: Vague nodular opacity overlying the right upper lung probably represents an EKG lead sticker. Otherwise, the lungs are clear. No overt pulmonary edema. Heart and mediastinum are within normal limits. Trachea is midline. No pleural effusions. No acute bone abnormality. IMPRESSION: No active cardiopulmonary disease. Electronically Signed   By: Markus Daft M.D.   On: 02/06/2023 16:21    Procedures Procedures    Medications Ordered in ED Medications  potassium chloride SA (KLOR-CON M) CR tablet 20 mEq (20 mEq Oral Given 02/06/23 1650)    ED Course/ Medical Decision Making/ A&P                             Medical Decision Making Amount and/or Complexity of Data Reviewed Labs: ordered. Radiology: ordered.  Risk Prescription drug management.    52 year old female presenting to the emergency department with a chief complaint of productive cough and pleuritic chest discomfort that started roughly 4 days ago.  She denies any sick contacts.  She denies any fevers or chills.  She endorses mild shortness of breath.  She denies any swelling in her lower extremities, denies any hemoptysis, denies any history of PE or DVT, denies any use of estrogen containing birth control.  She states that she is bringing up a small amount of sputum.  She is otherwise tolerating oral intake and well-appearing.  On arrival, the patient was afebrile, not  tachycardic or tachypneic, BP 190 62/97, saturating 95% on room air.  Physical exam generally unremarkable.  Differential diagnosis includes viral bronchitis versus developing bacterial pneumonia given the increasing production of sputum.  Laboratory evaluation significant for mild leukocytosis to 11.4, COVID-19, influenza, RSV PCR testing negative, troponin normal, BMP with mild hypokalemia to 3.4, otherwise with mild hyperglycemia 142 and unremarkable.   Low concern for PE and the patient is PERC negative.  Patient potassium was replenished orally.  Chest x-ray was performed revealed clear lungs, no clear focal consolidation.  EKG was unremarkable.  Suspect viral bronchitis versus developing bacterial pneumonia.  Will treat symptomatically and start the patient on azithromycin.  Advised outpatient follow-up and return precautions as needed.   Final Clinical Impression(s) / ED Diagnoses Final diagnoses:  Acute bronchitis, unspecified organism  Hypokalemia    Rx / DC Orders ED Discharge Orders          Ordered    benzonatate (TESSALON) 100 MG capsule  Every 8 hours        02/06/23 1655    azithromycin (ZITHROMAX) 250 MG tablet  Daily        02/06/23 1655    methylPREDNISolone (MEDROL DOSEPAK) 4 MG TBPK tablet        02/06/23 1655              Regan Lemming, MD 02/06/23 1655

## 2023-03-10 IMAGING — CT CT HEAD W/O CM
3 series · 16 of 47 positions shown, 19 images · non-contrast
Comparison: Report from head CT 06/09/2012 (images unavailable).

CLINICAL DATA: Headache, intracranial hemorrhage suspected;
Dizziness, non-specific. Additional history provided: Patient
reports headache, dizziness since [REDACTED].

EXAM:
CT HEAD WITHOUT CONTRAST
TECHNIQUE: Contiguous axial images were obtained from the base of the skull
through the vertex without intravenous contrast.

[Series 2: head wo · axial · 0.43mm/px · z∈[-175,-45]mm · 10 of 32 slices shown, 13 images]
[im 3/32  brain]
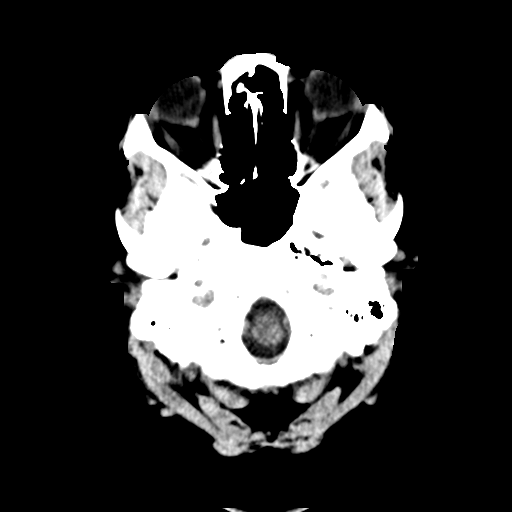
[im 3/32  bone]
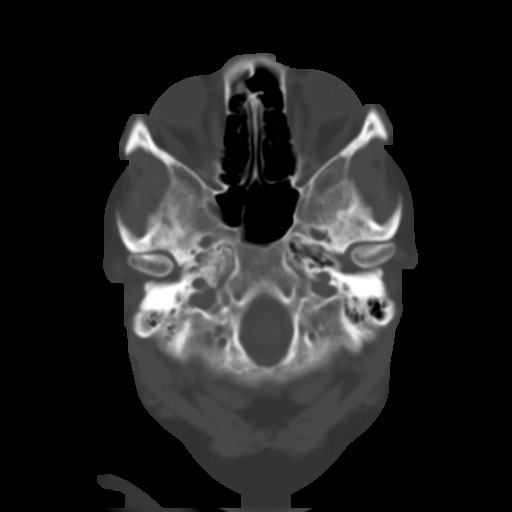
[im 6/32  brain]
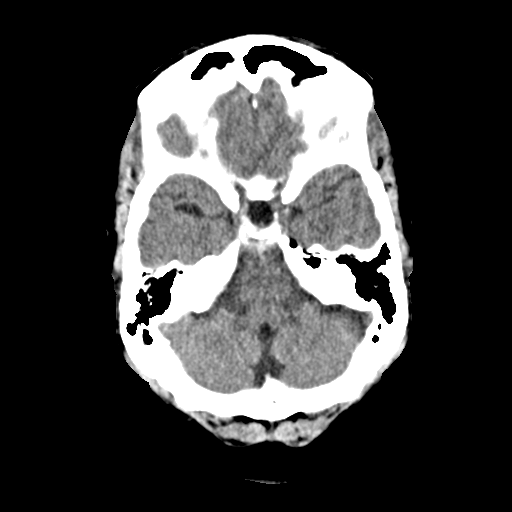
[im 9/32  brain]
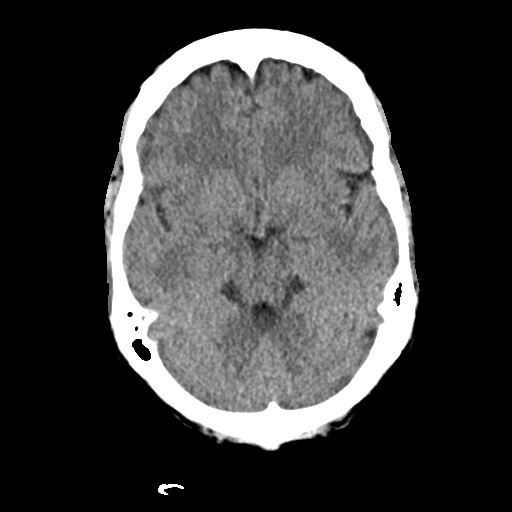
[im 11/32  brain]
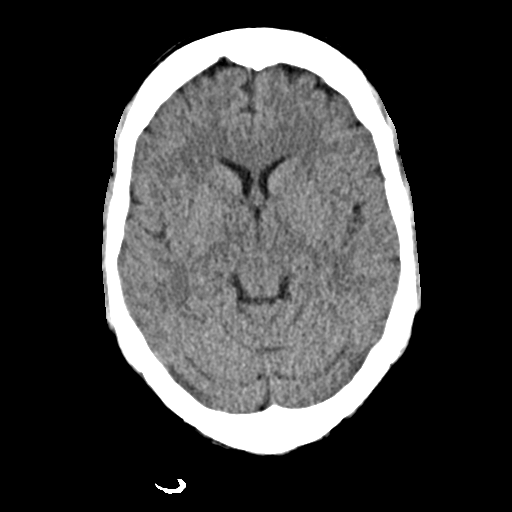
[im 14/32  brain]
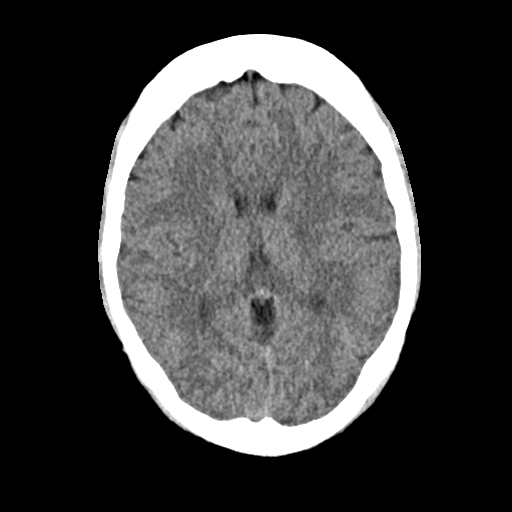
[im 14/32  bone]
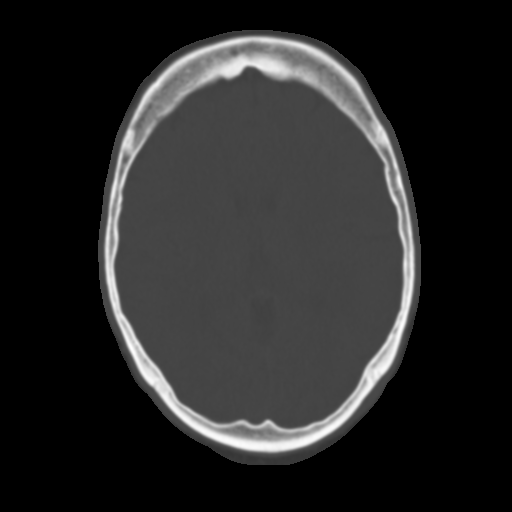
[im 18/32  brain]
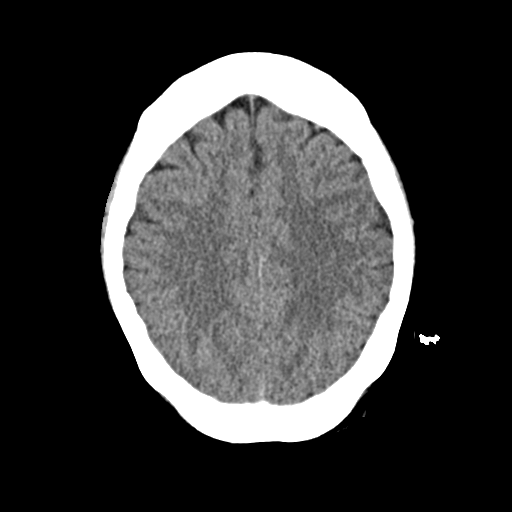
[im 21/32  brain]
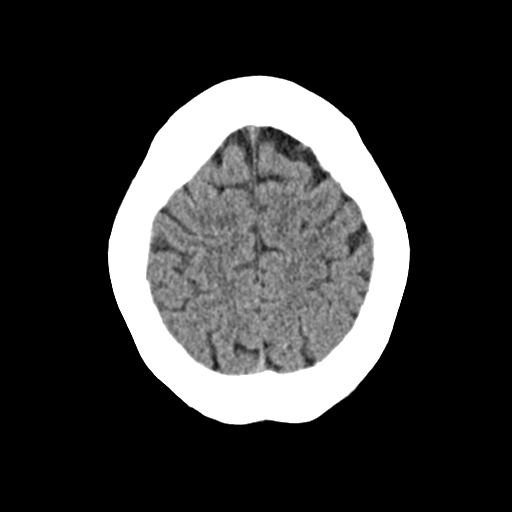
[im 24/32  brain]
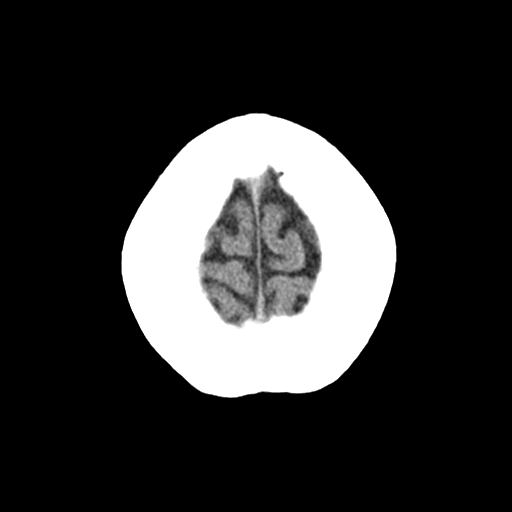
[im 26/32  brain]
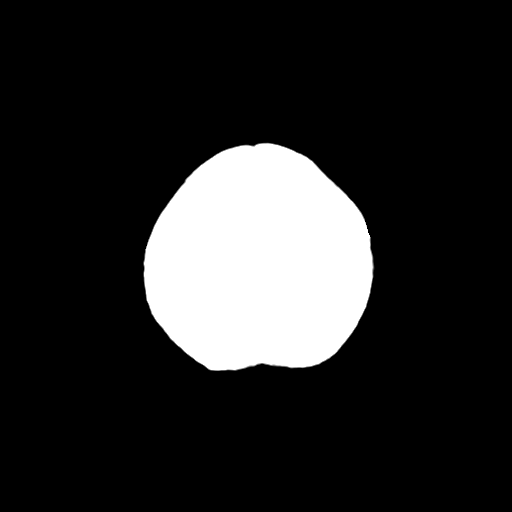
[im 26/32  bone]
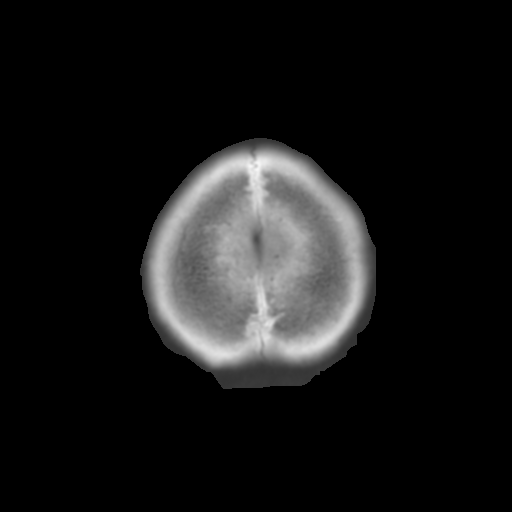
[im 29/32  brain]
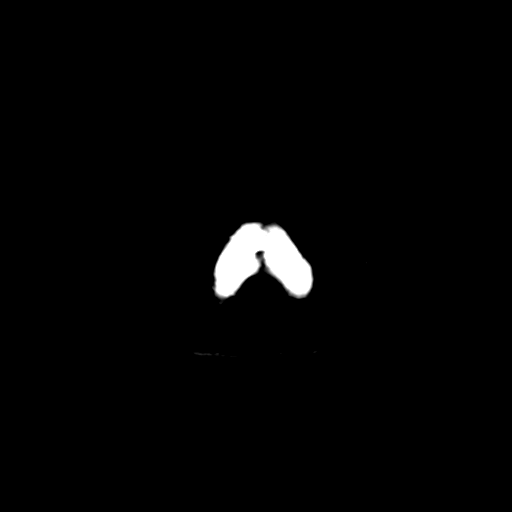

[Series 4: coronal soft · coronal · 0.32mm/px · 3 of 69 slices shown]
[im 23/69  brain]
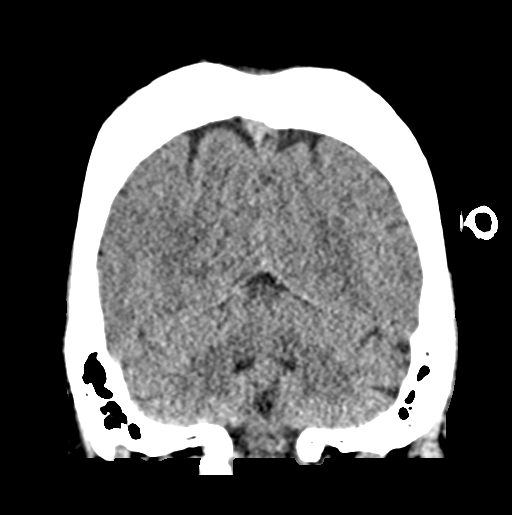
[im 31/69  brain]
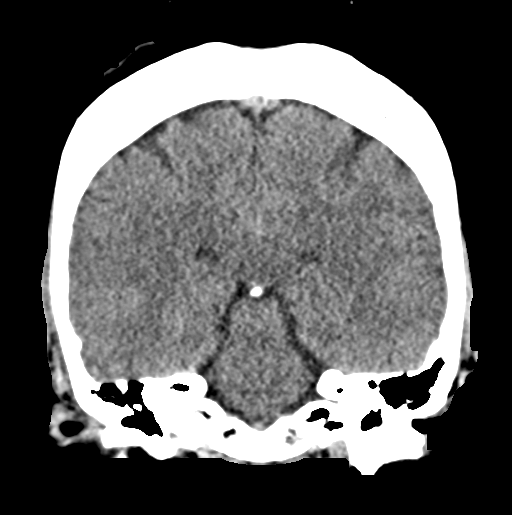
[im 38/69  brain]
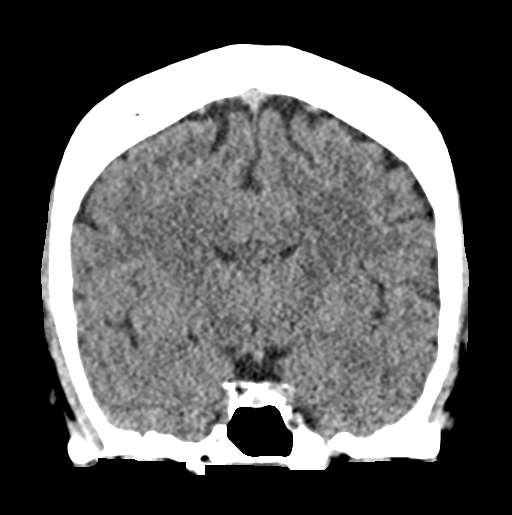

[Series 5: sag soft · sagittal · 0.32mm/px · 3 of 51 slices shown]
[im 17/51  brain]
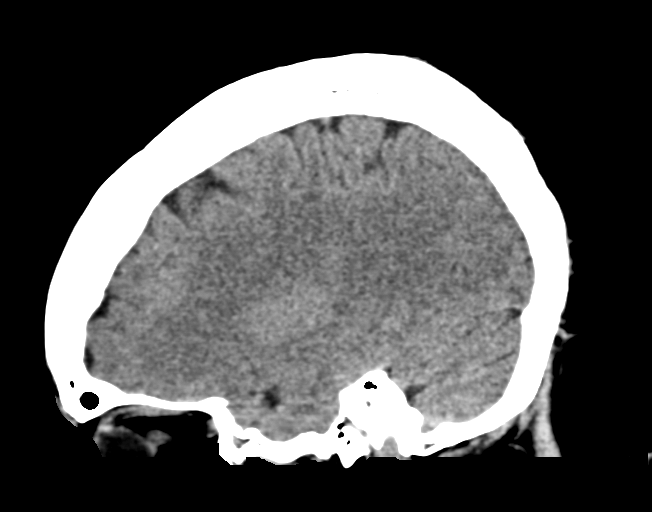
[im 26/51  brain]
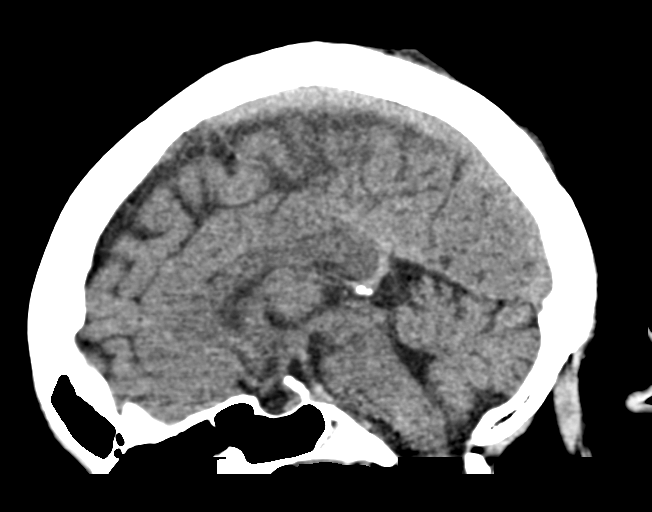
[im 34/51  brain]
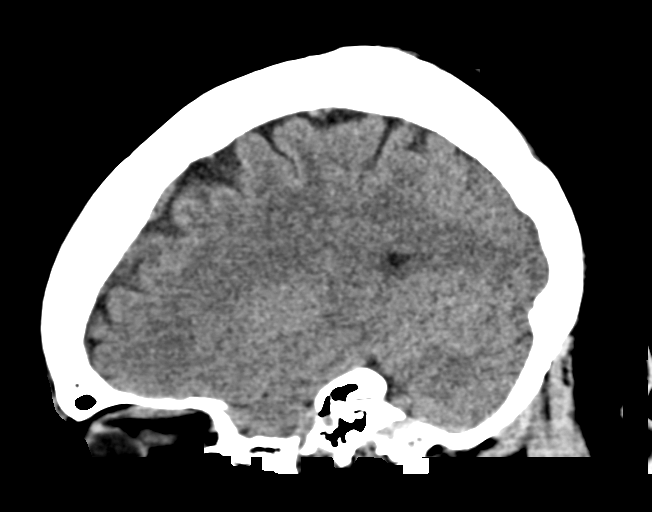

[16 of 47 positions shown; findings below may reference images not displayed]

FINDINGS: Brain:

Cerebral volume is normal.

There is no acute intracranial hemorrhage.

No demarcated cortical infarct.

No extra-axial fluid collection.

No evidence of an intracranial mass.

No midline shift.

Partially empty sella turcica.

Vascular: No hyperdense vessel.

Skull: Normal. Negative for fracture or focal lesion.

Sinuses/Orbits: Visualized orbits show no acute finding. No
significant paranasal sinus disease at the imaged levels.
IMPRESSION: No evidence of acute intracranial abnormality.

Partially empty sella turcica. This finding is very commonly
incidental, but can be associated with idiopathic intracranial
hypertension.

## 2024-04-25 ENCOUNTER — Observation Stay (HOSPITAL_BASED_OUTPATIENT_CLINIC_OR_DEPARTMENT_OTHER)
Admission: EM | Admit: 2024-04-25 | Discharge: 2024-04-27 | Disposition: A | Discharge status: Home / Self Care | Attending: Internal Medicine | Admitting: Internal Medicine

## 2024-04-25 ENCOUNTER — Other Ambulatory Visit: Payer: Self-pay

## 2024-04-25 DIAGNOSIS — I10 Essential (primary) hypertension: Secondary | ICD-10-CM | POA: Diagnosis not present

## 2024-04-25 DIAGNOSIS — E785 Hyperlipidemia, unspecified: Secondary | ICD-10-CM | POA: Diagnosis not present

## 2024-04-25 DIAGNOSIS — I7775 Dissection of other precerebral arteries: Principal | ICD-10-CM | POA: Insufficient documentation

## 2024-04-25 DIAGNOSIS — Z7901 Long term (current) use of anticoagulants: Secondary | ICD-10-CM | POA: Insufficient documentation

## 2024-04-25 DIAGNOSIS — Z7984 Long term (current) use of oral hypoglycemic drugs: Secondary | ICD-10-CM | POA: Diagnosis not present

## 2024-04-25 DIAGNOSIS — E876 Hypokalemia: Secondary | ICD-10-CM | POA: Diagnosis not present

## 2024-04-25 DIAGNOSIS — D72829 Elevated white blood cell count, unspecified: Secondary | ICD-10-CM | POA: Insufficient documentation

## 2024-04-25 DIAGNOSIS — Z79899 Other long term (current) drug therapy: Secondary | ICD-10-CM | POA: Diagnosis not present

## 2024-04-25 DIAGNOSIS — E1165 Type 2 diabetes mellitus with hyperglycemia: Secondary | ICD-10-CM | POA: Diagnosis not present

## 2024-04-25 DIAGNOSIS — I779 Disorder of arteries and arterioles, unspecified: Secondary | ICD-10-CM | POA: Diagnosis present

## 2024-04-25 DIAGNOSIS — R42 Dizziness and giddiness: Secondary | ICD-10-CM | POA: Diagnosis present

## 2024-04-25 LAB — CBC
HCT: 43.6 % (ref 36.0–46.0)
Hemoglobin: 14 g/dL (ref 12.0–15.0)
MCH: 27.4 pg (ref 26.0–34.0)
MCHC: 32.1 g/dL (ref 30.0–36.0)
MCV: 85.3 fL (ref 80.0–100.0)
Platelets: 332 10*3/uL (ref 150–400)
RBC: 5.11 MIL/uL (ref 3.87–5.11)
RDW: 12.2 % (ref 11.5–15.5)
WBC: 12.6 10*3/uL — ABNORMAL HIGH (ref 4.0–10.5)
nRBC: 0 % (ref 0.0–0.2)

## 2024-04-25 LAB — COMPREHENSIVE METABOLIC PANEL WITH GFR
ALT: 11 U/L (ref 0–44)
AST: 16 U/L (ref 15–41)
Albumin: 4 g/dL (ref 3.5–5.0)
Alkaline Phosphatase: 111 U/L (ref 38–126)
Anion gap: 12 (ref 5–15)
BUN: 12 mg/dL (ref 6–20)
CO2: 26 mmol/L (ref 22–32)
Calcium: 9.4 mg/dL (ref 8.9–10.3)
Chloride: 101 mmol/L (ref 98–111)
Creatinine, Ser: 0.84 mg/dL (ref 0.44–1.00)
GFR, Estimated: >60 mL/min (ref >60)
Glucose, Bld: 175 mg/dL — ABNORMAL HIGH (ref 70–99)
Potassium: 3.4 mmol/L — ABNORMAL LOW (ref 3.5–5.1)
Sodium: 139 mmol/L (ref 135–145)
Total Bilirubin: 0.2 mg/dL (ref 0.0–1.2)
Total Protein: 7.6 g/dL (ref 6.5–8.1)

## 2024-04-25 MED ORDER — DIAZEPAM 5 MG/ML IJ SOLN
10.0000 mg | Freq: Once | INTRAMUSCULAR | Status: AC
  Administered 2024-04-25: 10 mg via INTRAVENOUS
  Filled 2024-04-25: qty 2

## 2024-04-25 MED ORDER — LACTATED RINGERS IV BOLUS
1000.0000 mL | Freq: Once | INTRAVENOUS | Status: AC
  Administered 2024-04-25: 1000 mL via INTRAVENOUS

## 2024-04-25 NOTE — ED Provider Notes (Signed)
 Diane Reese Provider Note   CSN: [id number] Arrival date & time: 04/25/24  2025     History Chief Complaint  Patient presents with   Dizziness    HPI Diane Reese is a 53 y.o. female presenting for vertiginous dizziness. History of BPPV Woke up at 0530 with the symptoms Went to bed at 2200 without symptoms (25 hours ago) No fevers or chills having vomiting x3 NBNB   Patient's recorded medical, surgical, social, medication list and allergies were reviewed in the Snapshot window as part of the initial history.   Review of Systems   Review of Systems  Constitutional:  Negative for chills and fever.  HENT:  Negative for ear pain and sore throat.   Eyes:  Negative for pain and visual disturbance.  Respiratory:  Negative for cough and shortness of breath.   Cardiovascular:  Negative for chest pain and palpitations.  Gastrointestinal:  Positive for nausea and vomiting. Negative for abdominal pain.  Genitourinary:  Negative for dysuria and hematuria.  Musculoskeletal:  Negative for arthralgias and back pain.  Skin:  Negative for color change and rash.  Neurological:  Positive for dizziness. Negative for seizures, syncope and numbness.  All other systems reviewed and are negative.   Physical Exam Updated Vital Signs BP (!) 163/138   Pulse 90   Temp 98 F (36.7 C) (Temporal)   Resp 17   SpO2 100%  Physical Exam Vitals and nursing note reviewed.  Constitutional:      General: She is not in acute distress.    Appearance: She is well-developed.  HENT:     Head: Normocephalic and atraumatic.  Eyes:     Conjunctiva/sclera: Conjunctivae normal.  Cardiovascular:     Rate and Rhythm: Normal rate and regular rhythm.     Heart sounds: No murmur heard. Pulmonary:     Effort: Pulmonary effort is normal. No respiratory distress.     Breath sounds: Normal breath sounds.  Abdominal:     General: There is no distension.      Palpations: Abdomen is soft.     Tenderness: There is no abdominal tenderness. There is no right CVA tenderness or left CVA tenderness.  Musculoskeletal:        General: No swelling or tenderness. Normal range of motion.     Cervical back: Neck supple.  Skin:    General: Skin is warm and dry.  Neurological:     General: No focal deficit present.     Mental Status: She is alert and oriented to person, place, and time. Mental status is at baseline.     Cranial Nerves: No cranial nerve deficit.      ED Course/ Medical Decision Making/ A&P Clinical Course as of 04/26/24 0529  Sat Apr 26, 2024  0256 Page to on call neurology [CC]  [phone] Neurology requested dual antiplatelet therapy and admission to medicine. [CC]    Clinical Course User Index [CC] Onetha Bile, MD    Procedures .Critical Care  Performed by: Onetha Bile, MD Authorized by: Onetha Bile, MD   Critical care provider statement:    Critical care time (minutes):  90   Critical care was time spent personally by me on the following activities:  Development of treatment plan with patient or surrogate, discussions with consultants, evaluation of patient's response to treatment, examination of patient, ordering and review of laboratory studies, ordering and review of radiographic studies, ordering and performing treatments and interventions, pulse oximetry, re-evaluation  of patient's condition and review of old charts   Care discussed with: accepting provider at another facility      Medications Ordered in ED Medications  lactated ringers  bolus 1,000 mL (1,000 mLs Intravenous New Bag/Given 04/25/24 2359)  diazepam (VALIUM) injection 10 mg (10 mg Intravenous Given 04/25/24 2358)  iohexol (OMNIPAQUE) 350 MG/ML injection 75 mL (75 mLs Intravenous Contrast Given 04/26/24 0048)  clopidogrel (PLAVIX) tablet 300 mg (300 mg Oral Given 04/26/24 0427)  aspirin chewable tablet 324 mg (324 mg Oral Given 04/26/24 0427)    Medical Decision Making:   Diane Reese is a 53 y.o. female who presented to the ED today with dizziness detailed above.    Patient placed on continuous vitals and telemetry monitoring while in ED which was reviewed periodically.  Complete initial physical exam performed, notably the patient  was hemodynamically stable in no acute distress.  Severely vertiginous.     Reviewed and confirmed nursing documentation for past medical history, family history, social history.    Initial Assessment:   With the patient's presentation of episodic, intermittent dizziness that is worsened with motion activity, most likely diagnosis is BPPV. Other diagnoses were considered including (but not limited to) CVA, Labyrinthitis, Vestibular Neuritis, Mnire's disease, occipital migraine, Inner Ear infection. These are considered less likely due to history of present illness and physical exam findings.   This is most consistent with an acute life/limb threatening illness complicated by underlying chronic conditions. Notably, due to duration of symptoms being greater than 4 and half hours, patient is not a candidate for code stroke activation.  Initial Plan:  CT head to evaluate for intracranial hemorrhage or intracranial mass as etiology. Due to duration of symptoms, would expect reasonable sensitivity of this test for CVA.  Additionally, will include angiography studies due to some reported worsening tinnitus Screening labs including CBC and Metabolic panel to evaluate for infectious or metabolic etiology of disease.  Urinalysis with reflex culture ordered to evaluate for UTI or relevant urologic/nephrologic pathology.  EKG to evaluate for cardiac pathology Objective evaluation as below reviewed with plan for reassessment after administration of Meclizine.  Initial Study Results:   Laboratory  All laboratory results reviewed without evidence of clinically relevant pathology.     EKG EKG was reviewed  independently. Rate, rhythm, axis, intervals all examined and without medically relevant abnormality. ST segments without concerns for elevations.    Radiology:  All images reviewed independently. Agree with radiology report at this time.   CT ANGIO HEAD NECK W WO CM Result Date: 04/26/2024 CLINICAL DATA:  Initial evaluation for neuro deficit, stroke suspected. EXAM: CT ANGIOGRAPHY HEAD AND NECK WITH AND WITHOUT CONTRAST TECHNIQUE: Multidetector CT imaging of the head and neck was performed using the standard protocol during bolus administration of intravenous contrast. Multiplanar CT image reconstructions and MIPs were obtained to evaluate the vascular anatomy. Carotid stenosis measurements (when applicable) are obtained utilizing NASCET criteria, using the distal internal carotid diameter as the denominator. RADIATION DOSE REDUCTION: This exam was performed according to the departmental dose-optimization program which includes automated exposure control, adjustment of the mA and/or kV according to patient size and/or use of iterative reconstruction technique. CONTRAST:  75mL OMNIPAQUE IOHEXOL 350 MG/ML SOLN COMPARISON:  CT from 07/26/2021 FINDINGS: CT HEAD FINDINGS Brain: Cerebral volume within normal limits for patient age. No acute intracranial hemorrhage. No acute large vessel territory infarct. No mass lesion, midline shift, or mass effect. Ventricles are normal in size without hydrocephalus. No extra-axial fluid collection. Vascular:  No abnormal hyperdense vessel. Skull: Scalp soft tissues demonstrate no acute abnormality. Calvarium intact. Sinuses/Orbits: Globes and orbital soft tissues within normal limits. Visualized paranasal sinuses are largely clear. No significant mastoid effusion. CTA NECK FINDINGS Aortic arch: Standard branching. Imaged portion shows no evidence of aneurysm or dissection. No significant stenosis of the major arch vessel origins. Right carotid system: No evidence of dissection,  stenosis (50% or greater), or occlusion. Left carotid system: No evidence of dissection, stenosis (50% or greater), or occlusion. Vertebral arteries: No evidence of dissection, stenosis (50% or greater), or occlusion. Skeleton: No worrisome osseous lesions. Other neck: No other acute finding. Upper chest: No other acute finding. Review of the MIP images confirms the above findings CTA HEAD FINDINGS Anterior circulation: Both internal carotid arteries are widely patent through the siphons without stenosis. A1 segments, anterior communicating artery complex, and anterior cerebral arteries patent without stenosis. No M1 stenosis or occlusion. Distal MCA branches perfused and symmetric. Posterior circulation: Both V4 segments patent without stenosis. Right vertebral artery dominant. Both PICA patent. Focal intimal irregularity and filling defect noted involving the posterior aspect of the mid basilar artery (series 19, image 193). Secondary mild stenosis at this level. Basilar widely patent distally. Superior cerebral arteries patent bilaterally. Both PCAs primarily supplied via the basilar well perfused to their distal aspects without stenosis. Venous sinuses: Patent allowing for timing the contrast bolus. Anatomic variants: None significant.  No aneurysm. Review of the MIP images confirms the above findings IMPRESSION: 1. Focal intimal irregularity and filling defect involving the mid basilar artery as above. Finding suspicious for short-segment dissection with intraluminal thrombus. Secondary mild stenosis at this level. A short-segment fenestration is considered, but felt to be less likely given the eccentric nature of this finding. 2. Otherwise negative CTA of the head and neck. No other large vessel occlusion, hemodynamically significant stenosis, or other abnormality. Critical Value/emergent results were called by telephone at the time of interpretation on 04/26/2024 at 2:49 am to provider [facility] , who  verbally acknowledged these results. Electronically Signed   By: Virgia Griffins M.D.   On: 04/26/2024 02:53      Consults: Case discussed with neurology Dr. Alecia Ames.   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  Patient informed of these findings.  Neurology recommended administration of dual antiplatelet therapy with plan to be admitted to the hospitalist service for neurology consultation.   Disposition:   Based on the above findings, I believe this patient is stable for admission.    Patient/family educated about specific findings on our evaluation and explained exact reasons for admission.  Patient/family educated about clinical situation and time was allowed to answer questions.   Admission team communicated with and agreed with need for admission. Patient admitted. Patient ready to move at this time.     Emergency Department Medication Summary:   Medications  lactated ringers  bolus 1,000 mL (1,000 mLs Intravenous New Bag/Given 04/25/24 2359)  diazepam (VALIUM) injection 10 mg (10 mg Intravenous Given 04/25/24 2358)  iohexol (OMNIPAQUE) 350 MG/ML injection 75 mL (75 mLs Intravenous Contrast Given 04/26/24 0048)  clopidogrel (PLAVIX) tablet 300 mg (300 mg Oral Given 04/26/24 0427)  aspirin chewable tablet 324 mg (324 mg Oral Given 04/26/24 0427)         Clinical Impression:  1. Dissection of basilar artery (HCC)      Admit   Final Clinical Impression(s) / ED Diagnoses Final diagnoses:  Dissection of basilar artery (HCC)  Rx / DC Orders ED Discharge Orders     None         Onetha Bile, MD 04/26/24 [phone]

## 2024-04-25 NOTE — ED Triage Notes (Signed)
 Pt ambulatory to triage in NAD reporting dizziness and n/v since 5:30 this morning. Reports hx of vertigo in the past.  Pt has pain behind her ear

## 2024-04-26 ENCOUNTER — Encounter (HOSPITAL_BASED_OUTPATIENT_CLINIC_OR_DEPARTMENT_OTHER): Payer: Self-pay

## 2024-04-26 ENCOUNTER — Observation Stay (HOSPITAL_COMMUNITY): Payer: Self-pay

## 2024-04-26 ENCOUNTER — Emergency Department (HOSPITAL_BASED_OUTPATIENT_CLINIC_OR_DEPARTMENT_OTHER): Payer: Self-pay

## 2024-04-26 DIAGNOSIS — I651 Occlusion and stenosis of basilar artery: Secondary | ICD-10-CM

## 2024-04-26 DIAGNOSIS — Z79899 Other long term (current) drug therapy: Secondary | ICD-10-CM | POA: Diagnosis not present

## 2024-04-26 DIAGNOSIS — E876 Hypokalemia: Secondary | ICD-10-CM | POA: Diagnosis not present

## 2024-04-26 DIAGNOSIS — I779 Disorder of arteries and arterioles, unspecified: Secondary | ICD-10-CM | POA: Diagnosis present

## 2024-04-26 DIAGNOSIS — E785 Hyperlipidemia, unspecified: Secondary | ICD-10-CM | POA: Diagnosis not present

## 2024-04-26 DIAGNOSIS — I10 Essential (primary) hypertension: Secondary | ICD-10-CM | POA: Diagnosis not present

## 2024-04-26 DIAGNOSIS — E1165 Type 2 diabetes mellitus with hyperglycemia: Secondary | ICD-10-CM | POA: Diagnosis not present

## 2024-04-26 DIAGNOSIS — Z7984 Long term (current) use of oral hypoglycemic drugs: Secondary | ICD-10-CM | POA: Diagnosis not present

## 2024-04-26 DIAGNOSIS — I7775 Dissection of other precerebral arteries: Secondary | ICD-10-CM | POA: Diagnosis not present

## 2024-04-26 DIAGNOSIS — R42 Dizziness and giddiness: Secondary | ICD-10-CM | POA: Diagnosis present

## 2024-04-26 DIAGNOSIS — Z7901 Long term (current) use of anticoagulants: Secondary | ICD-10-CM | POA: Diagnosis not present

## 2024-04-26 DIAGNOSIS — D72829 Elevated white blood cell count, unspecified: Secondary | ICD-10-CM | POA: Diagnosis not present

## 2024-04-26 LAB — URINALYSIS, ROUTINE W REFLEX MICROSCOPIC
Bilirubin Urine: NEGATIVE
Glucose, UA: NEGATIVE mg/dL
Hgb urine dipstick: NEGATIVE
Ketones, ur: NEGATIVE mg/dL
Leukocytes,Ua: NEGATIVE
Nitrite: NEGATIVE
Protein, ur: NEGATIVE mg/dL
Specific Gravity, Urine: 1.015 (ref 1.005–1.030)
pH: 6.5 (ref 5.0–8.0)

## 2024-04-26 LAB — CBC
HCT: 40.8 % (ref 36.0–46.0)
Hemoglobin: 13.2 g/dL (ref 12.0–15.0)
MCH: 27.6 pg (ref 26.0–34.0)
MCHC: 32.4 g/dL (ref 30.0–36.0)
MCV: 85.4 fL (ref 80.0–100.0)
Platelets: 303 10*3/uL (ref 150–400)
RBC: 4.78 MIL/uL (ref 3.87–5.11)
RDW: 12.5 % (ref 11.5–15.5)
WBC: 10.8 10*3/uL — ABNORMAL HIGH (ref 4.0–10.5)
nRBC: 0 % (ref 0.0–0.2)

## 2024-04-26 LAB — GLUCOSE, CAPILLARY
Glucose-Capillary: 132 mg/dL — ABNORMAL HIGH (ref 70–99)
Glucose-Capillary: 195 mg/dL — ABNORMAL HIGH (ref 70–99)
Glucose-Capillary: 141 mg/dL — ABNORMAL HIGH (ref 70–99)

## 2024-04-26 LAB — BASIC METABOLIC PANEL WITH GFR
Anion gap: 10 (ref 5–15)
BUN: 9 mg/dL (ref 6–20)
CO2: 26 mmol/L (ref 22–32)
Calcium: 8.7 mg/dL — ABNORMAL LOW (ref 8.9–10.3)
Chloride: 104 mmol/L (ref 98–111)
Creatinine, Ser: 0.74 mg/dL (ref 0.44–1.00)
GFR, Estimated: >60 mL/min (ref >60)
Glucose, Bld: 210 mg/dL — ABNORMAL HIGH (ref 70–99)
Potassium: 3.2 mmol/L — ABNORMAL LOW (ref 3.5–5.1)
Sodium: 140 mmol/L (ref 135–145)

## 2024-04-26 LAB — HEMOGLOBIN A1C
Hgb A1c MFr Bld: 7.6 % — ABNORMAL HIGH (ref 4.8–5.6)
Mean Plasma Glucose: 171.42 mg/dL

## 2024-04-26 MED ORDER — POTASSIUM CHLORIDE CRYS ER 20 MEQ PO TBCR
40.0000 meq | EXTENDED_RELEASE_TABLET | Freq: Once | ORAL | Status: AC
  Administered 2024-04-26: 40 meq via ORAL
  Filled 2024-04-26: qty 2

## 2024-04-26 MED ORDER — LORAZEPAM 2 MG/ML IJ SOLN
0.5000 mg | Freq: Once | INTRAMUSCULAR | Status: DC | PRN
  Filled 2024-04-26: qty 1

## 2024-04-26 MED ORDER — ENOXAPARIN SODIUM 40 MG/0.4ML IJ SOSY: 40.0000 mg | PREFILLED_SYRINGE | INTRAMUSCULAR | Status: DC

## 2024-04-26 MED ORDER — ENOXAPARIN SODIUM 60 MG/0.6ML IJ SOSY
50.0000 mg | PREFILLED_SYRINGE | INTRAMUSCULAR | Status: DC
  Administered 2024-04-26 – 2024-04-27 (×2): 50 mg via SUBCUTANEOUS
  Filled 2024-04-26 (×2): qty 0.6

## 2024-04-26 MED ORDER — CLOPIDOGREL BISULFATE 300 MG PO TABS
300.0000 mg | ORAL_TABLET | Freq: Once | ORAL | Status: AC
  Administered 2024-04-26: 300 mg via ORAL
  Filled 2024-04-26: qty 1

## 2024-04-26 MED ORDER — CLOPIDOGREL BISULFATE 75 MG PO TABS: 75.0000 mg | ORAL_TABLET | Freq: Every day | ORAL | Status: DC

## 2024-04-26 MED ORDER — ACETAMINOPHEN 160 MG/5ML PO SOLN: 650.0000 mg | ORAL | Status: DC | PRN

## 2024-04-26 MED ORDER — ACETAMINOPHEN 650 MG RE SUPP: 650.0000 mg | RECTAL | Status: DC | PRN

## 2024-04-26 MED ORDER — HYDRALAZINE HCL 20 MG/ML IJ SOLN: 5.0000 mg | INTRAMUSCULAR | Status: DC | PRN

## 2024-04-26 MED ORDER — ASPIRIN 81 MG PO CHEW
324.0000 mg | CHEWABLE_TABLET | Freq: Once | ORAL | Status: AC
  Administered 2024-04-26: 324 mg via ORAL
  Filled 2024-04-26: qty 4

## 2024-04-26 MED ORDER — SODIUM CHLORIDE 0.9 % IV SOLN: INTRAVENOUS | Status: AC

## 2024-04-26 MED ORDER — ACETAMINOPHEN 325 MG PO TABS: 650.0000 mg | ORAL_TABLET | ORAL | Status: DC | PRN

## 2024-04-26 MED ORDER — ASPIRIN 81 MG PO TBEC
81.0000 mg | DELAYED_RELEASE_TABLET | Freq: Every day | ORAL | Status: DC
  Administered 2024-04-26 – 2024-04-27 (×2): 81 mg via ORAL
  Filled 2024-04-26 (×2): qty 1

## 2024-04-26 MED ORDER — LABETALOL HCL 5 MG/ML IV SOLN
10.0000 mg | INTRAVENOUS | Status: DC | PRN
  Administered 2024-04-26 – 2024-04-27 (×2): 10 mg via INTRAVENOUS
  Filled 2024-04-26 (×2): qty 4

## 2024-04-26 MED ORDER — STROKE: EARLY STAGES OF RECOVERY BOOK
Freq: Once | Status: AC
  Filled 2024-04-26: qty 1

## 2024-04-26 MED ORDER — IOHEXOL 350 MG/ML SOLN
75.0000 mL | Freq: Once | INTRAVENOUS | Status: AC | PRN
  Administered 2024-04-26: 75 mL via INTRAVENOUS

## 2024-04-26 MED ORDER — CLOPIDOGREL BISULFATE 75 MG PO TABS
75.0000 mg | ORAL_TABLET | Freq: Every day | ORAL | Status: DC
  Administered 2024-04-26 – 2024-04-27 (×2): 75 mg via ORAL
  Filled 2024-04-26 (×2): qty 1

## 2024-04-26 NOTE — Progress Notes (Signed)
 Paged Admitting to notify of Pt. Arrival to unit.

## 2024-04-26 NOTE — ED Notes (Signed)
 [facility] called for transport.

## 2024-04-26 NOTE — Consult Note (Signed)
 NEUROLOGY CONSULT NOTE   Date of service: Apr 26, 2024 Patient Name: Diane Reese MRN:  [id number] DOB:  1971-11-09 Chief Complaint: "Basilar artery dissection" Requesting Provider: Mandy Second, MD  History of Present Illness  [name] is a 53 y.o. female with hx of hypertension, hyperlipidemia, diabetes type 2 who presented with dizziness, nausea and vomiting.  Patient reports that she went to bed at 2200 on 04/24/2024 and she woke up at 0530 the next day with intense dizziness, vertigo, nausea and vomiting.  This was persistent unlike her prior episodes of vertigo.  She was very off balance and this felt like she was leaning and going to her left.  She threw up a couple times in the bathroom.  In the afternoon, her husband returned and encouraged and brought her to the ED.  She initially presented to med [facility] emergency department where she had a CT angio of the head and neck which demonstrated short segment focal filling defect within the basilar artery most consistent with a basilar artery dissection.  She was loaded with aspirin 325 mg and Plavix 100 mg and started on aspirin and Plavix daily.  She was transferred to [facility] for in person neurology evaluation and further workup.  Patient denies any prior history of strokes, endorses family history of stroke.  She does not smoke, does not use any recreational substances, she does not drink alcohol.  She denies any recent visit to chiropractors.  She does recall 2 days ago, rapid head movement and she felt like she had a snap and pain in the back of her neck on the right side.  LKW: 2200 on 04/24/2024 Modified rankin score: 0-Completely asymptomatic and back to baseline post- stroke IV Thrombolysis: Not offered, outside the window and had some intracranial dissection which is high risk for hemorrhage.   EVT: Not offered, no LVO.    NIHSS components Score: Comment  1a Level of Conscious 0[x]  1[]  2[]  3[]      1b LOC  Questions 0[x]  1[]  2[]       1c LOC Commands 0[x]  1[]  2[]       2 Best Gaze 0[x]  1[]  2[]       3 Visual 0[x]  1[]  2[]  3[]      4 Facial Palsy 0[x]  1[]  2[]  3[]      5a Motor Arm - left 0[x]  1[]  2[]  3[]  4[]  UN[]    5b Motor Arm - Right 0[x]  1[]  2[]  3[]  4[]  UN[]    6a Motor Leg - Left 0[x]  1[]  2[]  3[]  4[]  UN[]    6b Motor Leg - Right 0[x]  1[]  2[]  3[]  4[]  UN[]    7 Limb Ataxia 0[x]  1[]  2[]  UN[]      8 Sensory 0[x]  1[]  2[]  UN[]      9 Best Language 0[x]  1[]  2[]  3[]      10 Dysarthria 0[x]  1[]  2[]  UN[]      11 Extinct. and Inattention 0[x]  1[]  2[]       TOTAL: 0      ROS  Comprehensive ROS performed and pertinent positives documented in HPI   Past History  History reviewed. No pertinent past medical history.  Past Surgical History:  Procedure Laterality Date   ABDOMINAL HYSTERECTOMY      Family History: Family History  Problem Relation Age of Onset   Stroke Sister     Social History  reports that she has never smoked. She has never used smokeless tobacco. She reports that she does not drink alcohol and does not use drugs.  Allergies  Allergen Reactions   Penicillins Hives    Medications   Current Facility-Administered Medications:    [START ON 04/27/2024]  stroke: early stages of recovery book, , Does not apply, Once, Jinwala, Sagar H, MD   0.9 %  sodium chloride  infusion, , Intravenous, Continuous, Jinwala, Sagar H, MD, Last Rate: 40 mL/hr at 04/26/24 1022, New Bag at 04/26/24 1022   acetaminophen  (TYLENOL ) tablet 650 mg, 650 mg, Oral, Q4H PRN **OR** acetaminophen  (TYLENOL ) 160 MG/5ML solution 650 mg, 650 mg, Per Tube, Q4H PRN **OR** acetaminophen  (TYLENOL ) suppository 650 mg, 650 mg, Rectal, Q4H PRN, Jinwala, Sagar H, MD   aspirin EC tablet 81 mg, 81 mg, Oral, Daily, Averi Kilty, MD, 81 mg at 04/26/24 1014   clopidogrel (PLAVIX) tablet 75 mg, 75 mg, Oral, Daily, Dierre Crevier, MD, 75 mg at 04/26/24 1014   enoxaparin (LOVENOX) injection 50 mg, 50 mg, Subcutaneous, Q24H,  Utomwen, Adesuwa, RPH, 50 mg at 04/26/24 1012   hydrALAZINE (APRESOLINE) injection 5 mg, 5 mg, Intravenous, Q4H PRN, Jinwala, Sagar H, MD   labetalol (NORMODYNE) injection 10 mg, 10 mg, Intravenous, Q2H PRN, Jinwala, Sagar H, MD   LORazepam (ATIVAN) injection 0.5 mg, 0.5 mg, Intravenous, Once PRN, Jinwala, Sagar H, MD   potassium chloride  SA (KLOR-CON  M) CR tablet 40 mEq, 40 mEq, Oral, Once, Mandy Second, MD  Vitals   Vitals:   04/26/24 0735 04/26/24 0900 04/26/24 0908 04/26/24 1117  BP: (!) 164/119 (!) 190/113  (!) 186/118  Pulse: 83 87  88  Resp: 14 20    Temp: 98 F (36.7 C) 98.7 F (37.1 C)  98.6 F (37 C)  TempSrc: Oral Oral  Oral  SpO2: 97% 100%  98%  Weight:   107 kg   Height:   5' 0.98" (1.549 m)     Body mass index is 44.59 kg/m.  Physical Exam   General: Laying comfortably in bed; in no acute distress.  HENT: Normal oropharynx and mucosa. Normal external appearance of ears and nose.  Neck: Supple, no pain or tenderness  CV: No JVD. No peripheral edema.  Pulmonary: Symmetric Chest rise. Normal respiratory effort.  Abdomen: Soft to touch, non-tender.  Ext: No cyanosis, edema, or deformity  Skin: No rash. Normal palpation of skin.   Musculoskeletal: Normal digits and nails by inspection. No clubbing.   Neurologic Examination  Mental status/Cognition: Alert, oriented to self, place, month and year, good attention.  Speech/language: Fluent, comprehension intact, object naming intact, repetition intact.  Cranial nerves:   CN II Pupils equal and reactive to light, no VF deficits    CN III,IV,VI EOM intact, no gaze preference or deviation, no nystagmus    CN V normal sensation in V1, V2, and V3 segments bilaterally    CN VII no asymmetry, no nasolabial fold flattening    CN VIII normal hearing to speech   CN IX & X normal palatal elevation, no uvular deviation    CN XI 5/5 head turn and 5/5 shoulder shrug bilaterally    CN XII midline tongue protrusion     Motor:  Muscle bulk: Normal, tone normal, pronator drift none tremor none Mvmt Root Nerve  Muscle Right Left Comments  SA C5/6 Ax Deltoid 5 5   EF C5/6 Mc Biceps 5 5   EE C6/7/8 Rad Triceps 5 5   WF C6/7 Med FCR     WE C7/8 PIN ECU     F Ab C8/T1 U ADM/FDI 5 5   HF L1/2/3 Fem  Illopsoas 4 5   KE L2/3/4 Fem Quad 5 5   DF L4/5 D Peron Tib Ant 5 5   PF S1/2 Tibial Grc/Sol 5 5    Sensation:  Light touch Intact throughout   Pin prick    Temperature    Vibration   Proprioception    Coordination/Complex Motor:  - Finger to Nose intact bilaterally - Heel to shin intact bilaterally - Rapid alternating movement are normal - Gait: Deferred for patient safety. Labs/Imaging/Neurodiagnostic studies   CBC:  Recent Labs  Lab 05/21/24 2052  WBC 12.6*  HGB 14.0  HCT 43.6  MCV 85.3  PLT 332   Basic Metabolic Panel:  Lab Results  Component Value Date   NA 139 21-May-2024   K 3.4 (L) 05/21/24   CO2 26 05/21/2024   GLUCOSE 175 (H) 21-May-2024   BUN 12 21-May-2024   CREATININE 0.84 05/21/24   CALCIUM 9.4 05/21/24   GFRNONAA >60 05/21/24   GFRAA >60 05/13/2017   Lipid Panel: No results found for: "LDLCALC" HgbA1c: No results found for: "HGBA1C" Urine Drug Screen: No results found for: "LABOPIA", "COCAINSCRNUR", "LABBENZ", "AMPHETMU", "THCU", "LABBARB"  Alcohol Level No results found for: "ETH" INR No results found for: "INR" APTT No results found for: "APTT" AED levels: No results found for: "PHENYTOIN", "ZONISAMIDE", "LAMOTRIGINE", "LEVETIRACETA"  CT Head without contrast(Personally reviewed): CTH was negative for a large hypodensity concerning for a large territory infarct or hyperdensity concerning for an ICH  CT angio Head and Neck with contrast(Personally reviewed): 1. Focal intimal irregularity and filling defect involving the mid basilar artery as above. Finding suspicious for short-segment dissection with intraluminal thrombus. Secondary mild stenosis at this  level. A short-segment fenestration is considered, but felt to be less likely given the eccentric nature of this finding. 2. Otherwise negative CTA of the head and neck. No other large vessel occlusion, hemodynamically significant stenosis, or other abnormality.  MRI Brain: Pending  ASSESSMENT   Kiora Shafer is a 53 y.o. female ith hx of hypertension, hyperlipidemia, diabetes type 2 who presented with dizziness, nausea and vomiting.  She was found to have a focal intimal irregularity/filling defect in the mid basilar artery concerning for dissection with intraluminal thrombus.  She endorses a couple days ago having a sharp pain in the back of her neck on the right from sudden head movement.  I suspect that could have been the etiology of her dissection.  I do not see any other significant abnormalities on the CTA including no infundibulum's or any other aneurysm to suggest underlying collagen vascular disease.  On exam, she does have mild right leg hip flexion weakness.  Suspect this could be secondary to a small stroke.  I would recommend continuing aspirin and Plavix.  Recommend full stroke workup with MRI of the brain and an echocardiogram.  RECOMMENDATIONS  - Frequent Neuro checks per stroke unit protocol - Recommend brain imaging with MRI Brain without contrast - Recommend obtaining TTE  - Recommend obtaining Lipid panel with LDL - Please start statin if LDL > 70 - Recommend HbA1c to evaluate for diabetes and how well it is controlled. - Antithrombotic -aspirin 81 mg daily along with Plavix 75 mg daily for 90 days.  Followed by aspirin 81 mg daily alone. -She will need an outpatient CT angio of the head and neck in about 2 months to evaluate stability of the noted dissection.  Further antiplatelet decision can be made based on results of the outpatient CT angio. - Recommend  DVT ppx - SBP goal -aim for gradual normotension. - Recommend Telemetry monitoring for arrythmia - Recommend  bedside swallow screen prior to PO intake. - Stroke education booklet - Recommend PT/OT/SLP consult   ______________________________________________________________________  Plan was discussed with patient, her husband at the bedside.  Plan was also discussed with the hospitalist team over phone.   Signed, Kanitra Purifoy, MD [facility]

## 2024-04-26 NOTE — ED Notes (Signed)
 Pt on the phone, [facility] awaiting transfer

## 2024-04-26 NOTE — Evaluation (Signed)
 Physical Therapy Brief Evaluation and Discharge Note Patient Details Name: Diane Reese MRN: [id number] DOB: [birth year] Today's Date: 04/26/2024   History of Present Illness  53 y.o. female presented 5/9 with dizziness, nausea and vomiting. 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 MH: hypertension, hyperlipidemia, diabetes type 2.   Clinical Impression  Patient evaluated by Physical Therapy with no further acute PT needs identified. All education has been completed and the patient has no further questions. Patient feels back to baseline. She was a little dizzy after initially waking up - noted left beat, horizontal nystagmus with left end range gaze, suppressed with Rt gaze. After mobilizing, she reports dizziness resolved. Educated on visual fixation techniques, BEFAST acronym, and OP Neuro Rehab for vestibular therapy if dizziness persists. BERG indicates low fall risk 54/56. Ambulates independently in hallway and room. Gross strength 5/5 throughout LEs. FNF and RAM WNL UEs. All questions answered, good family support at home. See below for any follow-up Physical Therapy or equipment needs. PT is signing off. Thank you for this referral.        PT Assessment Patient does not need any further PT services  Assistance Needed at Discharge  PRN    Equipment Recommendations None recommended by PT  Recommendations for Other Services       Precautions/Restrictions Precautions Precautions: None Recall of Precautions/Restrictions: Intact Restrictions Weight Bearing Restrictions Per Provider Order: No        Mobility  Bed Mobility Rolling: Independent Supine/Sidelying to sit: Independent Sit to supine/sidelying: Independent General bed mobility comments: Independent, no physical assist needed.  Transfers Overall transfer level: Modified independent Equipment used: None                General transfer comment: Slower transition but stable, no physical assist or AD needed.    Ambulation/Gait Ambulation/Gait assistance: Independent Gait Distance (Feet): 150 Feet Assistive device: None Gait Pattern/deviations: WFL(Within Functional Limits) Gait Speed: Pace WFL General Gait Details: Stable, no physical assist  Home Activity Instructions Home Activity Instructions: If dizziness persists, follow-up OPPT neuro rehab.  Stairs            Modified Rankin (Stroke Patients Only)        Balance Overall balance assessment: Modified Independent               Standardized Balance Assessment Standardized Balance Assessment :  (BERG: 54/56)        Pertinent Vitals/Pain   Pain Assessment Pain Assessment: No/denies pain     Home Living Family/patient expects to be discharged to:: Private residence Living Arrangements: Spouse/significant other Available Help at Discharge: Family;Available 24 hours/day Home Environment: Stairs to enter  [employer] of Steps: 3 Home Equipment: None        Prior Function Level of Independence: Independent Comments: ind, 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 MMT 5/5 at time of my assessment.)      Communication   Communication Communication: No apparent difficulties     Cognition Overall Cognitive Status: Appears within functional limits for tasks assessed/performed       General Comments General comments (skin integrity, edema, etc.): Demonstrates leftward gaze Left beat nystagmus, surpressed with Rt gaze. Educated on BEFAST acronym; verbalized understanding.  Exercises     Assessment/Plan    PT Problem List         PT Visit Diagnosis Dizziness and giddiness (R42);Unsteadiness on feet  (R26.81);Other abnormalities of gait and mobility (R26.89)    No Skilled PT All education completed;Patient at baseline level of functioning;Patient will have necessary level of assist by caregiver at discharge;Patient is modified independent with all activity/mobility   Co-evaluation                AMPAC 6 Clicks Help needed turning from your back to your side while in a flat bed without using bedrails?: None Help needed moving from lying on your back to sitting on the side of a flat bed without using bedrails?: None Help needed moving to and from a bed to a chair (including a wheelchair)?: None Help needed standing up from a chair using your arms (e.g., wheelchair or bedside chair)?: None Help needed to walk in hospital room?: None Help needed climbing 3-5 steps with a railing? : None 6 Click Score: 24      End of Session   Activity Tolerance: Patient tolerated treatment well Patient left: in bed;with call bell/phone within reach;with family/visitor present Nurse Communication: Mobility status PT Visit Diagnosis: Dizziness and giddiness (R42);Unsteadiness on feet (R26.81);Other abnormalities of gait and mobility (R26.89)     Time: 4098-1191 PT Time Calculation (min) (ACUTE ONLY): 16 min  Charges:   PT Evaluation $PT Eval Low Complexity: 1 Low      Jory Ng, PT, DPT [facility]  Rehabilitation Services Physical Therapist Office: [phone] Website: [email or web address]   Alinda Irani  04/26/2024, 2:47 PM

## 2024-04-26 NOTE — Plan of Care (Signed)
 Plan of Care Note for accepted transfer   Patient name: Diane Reese WUJ:[id number] DOB: February 01, 1971  Facility requesting transfer: [facility] ED Requesting Provider: Dr. Urban Garden Facility course: 53 year old female with history of hypertension, hyperlipidemia, type 2 diabetes presented to the ED with complaints of dizziness, nausea, and vomiting since 5:30 AM yesterday.  CTA head and neck showing findings suspicious for basilar artery dissection with intraluminal thrombus.  ED physician discussed the case with Dr. Alecia Ames with neurology who recommended starting dual antiplatelet therapy and admission for stroke workup.  Please notify neurology when patient arrives to the hospital.  Plan of care: The patient is accepted for admission to Progressive unit at [facility].  [facility] will assume care on arrival to accepting facility. Until arrival, care as per EDP. However, [facility] available 24/7 for questions and assistance.  Check [email or web address] for on-call coverage.  Nursing staff, please call [facility] Admits & Consults System-Wide number under Amion on patient's arrival so appropriate admitting provider can evaluate the pt.

## 2024-04-26 NOTE — H&P (Signed)
 History and Physical    Patient: Diane Reese UEA:[id number] DOB: 11-12-1971 DOA: 04/25/2024 DOS: the patient was seen and examined on 04/26/2024 PCP: Mayer Speaker, PA  Patient coming from: Home > [facility] ED > [facility]  Chief Complaint:  Chief Complaint  Patient presents with   Dizziness   HPI: Diane Reese is a 53 y.o. female with medical history significant of HTN, HLD, T2DM presented to the ED with dizziness, nausea, and vomiting.  Patient reports that she woke up yesterday morning at 5:30AM with dizziness. She sat up in bed and felt dizzy. She stayed in bed for a few minutes and dizziness slightly improved. After getting out of bed, she continued to feel dizzy and off-balance with ambulation. She felt that she was favoring her left side. She went to the bathroom and began feeling nauseous. She subsequently began experiencing several bouts of NBNB emesis through the morning into the afternoon until around 2PM. She has a history of vertigo and initially thought this was the cause of her symptoms. However, she does not experience nausea or vomiting with her typical vertigo and so this was new. She ultimately waited for her husband to arrive from work and he brought her to the ED for further evaluation. She does note drinking fluids yesterday and ate a burger in the afternoon without any difficulties. She denies any blurry vision, fevers, chills, chest pain, palpitations, SHOB, abdominal pain, urinary changes, weakness.   ED course: Vital signs stable aside from hypertension.  CBC with mild leukocytosis, otherwise unremarkable.  CMP with mild hypokalemia, glucose 175, otherwise unremarkable.  Urinalysis negative.  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  ED provider discussed with Dr. Alecia Ames with neurology, recommended starting DAPT and admission for stroke workup.  Patient transferred to [facility] for further evaluation  and care.  Review of Systems: As mentioned in the history of present illness. All other systems reviewed and are negative. History reviewed. No pertinent past medical history. Past Surgical History:  Procedure Laterality Date   ABDOMINAL HYSTERECTOMY     Social History:  reports that she has never smoked. She has never used smokeless tobacco. She reports that she does not drink alcohol and does not use drugs.  Allergies  Allergen Reactions   Penicillins Hives    Family History  Problem Relation Age of Onset   Stroke Sister     Prior to Admission medications   Medication Sig Start Date End Date Taking? Authorizing Provider  acetaZOLAMIDE  ER (DIAMOX ) 500 MG capsule Take 1 capsule (500 mg total) by mouth 2 (two) times daily. 09/01/21 10/01/21  Camara, Amadou, MD  azithromycin  (ZITHROMAX ) 250 MG tablet Take 1 tablet (250 mg total) by mouth daily. Take first 2 tablets together, then 1 every day until finished. 02/06/23   Rosealee Concha, MD  benzonatate  (TESSALON ) 100 MG capsule Take 1 capsule (100 mg total) by mouth every 8 (eight) hours. 02/06/23   Rosealee Concha, MD  cyclobenzaprine  (FLEXERIL ) 10 MG tablet Take 1 tablet (10 mg total) by mouth at bedtime and may repeat dose one time if needed. 01/06/20   Maryanna Smart, PA-C  lidocaine  (LIDODERM ) 5 % Place 1 patch onto the skin daily. Remove & Discard patch within 12 hours or as directed by MD 04/29/18   Evelyne Hires C, PA-C  methylPREDNISolone  (MEDROL  DOSEPAK) 4 MG TBPK tablet Take as prescribed on the box 02/06/23   Rosealee Concha, MD  naproxen  (NAPROSYN ) 500  MG tablet Take 1 tablet (500 mg total) by mouth 2 (two) times daily. 01/06/20   Maryanna Smart, PA-C  Phenyleph-Carbetapentane-GG 10-25-400 MG CAPS Take 1 capsule by mouth every 6 (six) hours as needed. 03/13/16   Gigi Kyle, NP    Physical Exam: Vitals:   04/26/24 0600 04/26/24 0730 04/26/24 0735 04/26/24 0900  BP: (!) 147/76 (!) 170/114 (!) 164/119 (!) 190/113  Pulse: 84 89  83 87  Resp: (!) 21 18 14 20   Temp:   98 F (36.7 C) 98.7 F (37.1 C)  TempSrc:   Oral Oral  SpO2: 95% 96% 97% 100%   Physical Exam Constitutional:      Appearance: Normal appearance. She is obese. She is not ill-appearing.  HENT:     Head: Normocephalic and atraumatic.     Mouth/Throat:     Mouth: Mucous membranes are moist.     Pharynx: Oropharynx is clear. No oropharyngeal exudate.  Eyes:     General: No scleral icterus.    Extraocular Movements: Extraocular movements intact.     Conjunctiva/sclera: Conjunctivae normal.     Pupils: Pupils are equal, round, and reactive to light.  Cardiovascular:     Rate and Rhythm: Normal rate and regular rhythm.     Pulses: Normal pulses.     Heart sounds: Normal heart sounds. No murmur heard.    No friction rub. No gallop.  Pulmonary:     Effort: Pulmonary effort is normal. No respiratory distress.     Breath sounds: Normal breath sounds. No wheezing, rhonchi or rales.  Abdominal:     General: Bowel sounds are normal. There is no distension.     Palpations: Abdomen is soft.     Tenderness: There is no abdominal tenderness. There is no guarding or rebound.  Musculoskeletal:        General: No swelling. Normal range of motion.     Cervical back: Normal range of motion.  Skin:    General: Skin is warm and dry.  Neurological:     General: No focal deficit present.     Mental Status: She is alert and oriented to person, place, and time.     Comments: Alert and oriented x 4.  No slurred speech, aphasia, or dysarthria noted.  PERRL, EOMI, no nystagmus noted.  Visual fields full.  Facial sensation symmetric and intact.  Facial muscles intact bilaterally.  Shoulder shrug intact bilaterally.  Uvula midline.  Tongue protrusion midline.  Strength 5 out of 5 in all extremities.  Sensation symmetric and intact bilaterally throughout.  FNF and HSH intact bilaterally.  Gait testing deferred.  Psychiatric:        Mood and Affect: Mood normal.         Behavior: Behavior normal.     Data Reviewed:  There are no new results to review at this time.    Latest Ref Rng & Units 04/25/2024    8:52 PM 02/06/2023    3:38 PM 07/26/2021    9:26 AM  CBC  WBC 4.0 - 10.5 K/uL 12.6  11.4  8.1   Hemoglobin 12.0 - 15.0 g/dL 16.1  09.6  04.5   Hematocrit 36.0 - 46.0 % 43.6  42.5  43.1   Platelets 150 - 400 K/uL 332  326  330       Latest Ref Rng & Units 04/25/2024    8:52 PM 02/06/2023    3:38 PM 07/26/2021    9:26 AM  BMP  Glucose 70 -  99 mg/dL 161  096  045   BUN 6 - 20 mg/dL 12  15  14    Creatinine 0.44 - 1.00 mg/dL 4.09  8.11  9.14   Sodium 135 - 145 mmol/L 139  138  139   Potassium 3.5 - 5.1 mmol/L 3.4  3.4  4.7   Chloride 98 - 111 mmol/L 101  103  102   CO2 22 - 32 mmol/L 26  27  29    Calcium 8.9 - 10.3 mg/dL 9.4  8.4  8.8    Urinalysis    Component Value Date/Time   COLORURINE YELLOW 04/25/2024 2051   APPEARANCEUR CLEAR 04/25/2024 2051   LABSPEC 1.015 04/25/2024 2051   PHURINE 6.5 04/25/2024 2051   GLUCOSEU NEGATIVE 04/25/2024 2051   HGBUR NEGATIVE 04/25/2024 2051   BILIRUBINUR NEGATIVE 04/25/2024 2051   KETONESUR NEGATIVE 04/25/2024 2051   PROTEINUR NEGATIVE 04/25/2024 2051   NITRITE NEGATIVE 04/25/2024 2051   LEUKOCYTESUR NEGATIVE 04/25/2024 2051    CT ANGIO HEAD NECK W WO CM Result Date: 04/26/2024 CLINICAL DATA:  Initial evaluation for neuro deficit, stroke suspected. EXAM: CT ANGIOGRAPHY HEAD AND NECK WITH AND WITHOUT CONTRAST TECHNIQUE: Multidetector CT imaging of the head and neck was performed using the standard protocol during bolus administration of intravenous contrast. Multiplanar CT image reconstructions and MIPs were obtained to evaluate the vascular anatomy. Carotid stenosis measurements (when applicable) are obtained utilizing NASCET criteria, using the distal internal carotid diameter as the denominator. RADIATION DOSE REDUCTION: This exam was performed according to the departmental dose-optimization program which  includes automated exposure control, adjustment of the mA and/or kV according to patient size and/or use of iterative reconstruction technique. CONTRAST:  75mL OMNIPAQUE IOHEXOL 350 MG/ML SOLN COMPARISON:  CT from 07/26/2021 FINDINGS: CT HEAD FINDINGS Brain: Cerebral volume within normal limits for patient age. No acute intracranial hemorrhage. No acute large vessel territory infarct. No mass lesion, midline shift, or mass effect. Ventricles are normal in size without hydrocephalus. No extra-axial fluid collection. Vascular: No abnormal hyperdense vessel. Skull: Scalp soft tissues demonstrate no acute abnormality. Calvarium intact. Sinuses/Orbits: Globes and orbital soft tissues within normal limits. Visualized paranasal sinuses are largely clear. No significant mastoid effusion. CTA NECK FINDINGS Aortic arch: Standard branching. Imaged portion shows no evidence of aneurysm or dissection. No significant stenosis of the major arch vessel origins. Right carotid system: No evidence of dissection, stenosis (50% or greater), or occlusion. Left carotid system: No evidence of dissection, stenosis (50% or greater), or occlusion. Vertebral arteries: No evidence of dissection, stenosis (50% or greater), or occlusion. Skeleton: No worrisome osseous lesions. Other neck: No other acute finding. Upper chest: No other acute finding. Review of the MIP images confirms the above findings CTA HEAD FINDINGS Anterior circulation: Both internal carotid arteries are widely patent through the siphons without stenosis. A1 segments, anterior communicating artery complex, and anterior cerebral arteries patent without stenosis. No M1 stenosis or occlusion. Distal MCA branches perfused and symmetric. Posterior circulation: Both V4 segments patent without stenosis. Right vertebral artery dominant. Both PICA patent. Focal intimal irregularity and filling defect noted involving the posterior aspect of the mid basilar artery (series 19, image 193).  Secondary mild stenosis at this level. Basilar widely patent distally. Superior cerebral arteries patent bilaterally. Both PCAs primarily supplied via the basilar well perfused to their distal aspects without stenosis. Venous sinuses: Patent allowing for timing the contrast bolus. Anatomic variants: None significant.  No aneurysm. Review of the MIP images confirms the above findings IMPRESSION: 1.  Focal intimal irregularity and filling defect involving the mid basilar artery as above. Finding suspicious for short-segment dissection with intraluminal thrombus. Secondary mild stenosis at this level. A short-segment fenestration is considered, but felt to be less likely given the eccentric nature of this finding. 2. Otherwise negative CTA of the head and neck. No other large vessel occlusion, hemodynamically significant stenosis, or other abnormality. Critical Value/emergent results were called by telephone at the time of interpretation on 04/26/2024 at 2:49 am to provider [facility] , who verbally acknowledged these results. Electronically Signed   By: Virgia Griffins M.D.   On: 04/26/2024 02:53    Assessment and Plan: No notes have been filed under this hospital service. 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  CTA head and neck with findings suspicious for short segment basilar artery dissection with intraluminal thrombus.  Neurology has been consulted, recommended DAPT and admission for stroke workup.  She has already received loading doses of aspirin and Plavix in the ED.  On my exam, no focal neurological deficits noted and she reports feeling much better.  She is hemodynamically stable.  Per neurology, will plan for DAPT for 3 months and repeat CTA head and neck in 2 months to reassess basilar artery. - Neurology following, appreciate assistance - Aspirin 81  mg daily and Plavix 75 mg daily for 3 months - Follow-up MRI brain, echocardiogram - Follow-up hemoglobin A1c, lipid panel - Lovenox for DVT prophylaxis - Telemetry - Permissive hypertension for first 48 hours - Added as needed IV labetalol and IV hydralazine should BP go above 210/110 - PT/OT eval - Fall precautions - passed bedside swallow eval, now on HH/CM diet - place in observation  Hypertension Patient with history of hypertension though not on any medications for this in the outpatient setting. BP ranging in the 160s-190s/100s. Currently allowing for permissive hypertension up to 210/110. - Permissive hypertension as noted above - IV labetalol 10 mg every 2 hours as needed for BP greater than 210/110 (hold if heart rate less than 65) - IV hydralazine 5 mg every 4 hours as needed for BP greater than 210/110 (unable to use labetalol)  Mild hypokalemia - K 3.4 on CMP, likely due to emesis as noted above - Giving 1 dose of p.o. potassium 40 mEq - Trend potassium level, replete as necessary  Leukocytosis WBC 12.6 on CBC, unclear etiology though likely reactive to acute neurological event. She has remained afebrile since arrival and is HDS. Urinalysis negative. No respiratory symptoms per patient. Will trend CBC and monitor for signs of infection. -trend WBC and fever curve  Hyperlipidemia - Not on any medications for this in the outpatient setting - Follow-up lipid panel tomorrow morning - Will need addition of statin therapy if LDL above 70  Type 2 diabetes with hyperglycemia Noted on chart review.  Patient not on any medications for this in the outpatient setting, appears to be diet controlled.  Blood glucose 175 on CMP. - Follow-up hemoglobin A1c - Trend CBGs, goal 140-180 - Consider adding SSI if CBGs above goal   Advance Care Planning:   Code Status: Full Code   Consults: Neurology  Family Communication: No family at bedside  Severity of Illness: The appropriate  patient status for this patient is OBSERVATION. Observation status is judged to be reasonable and necessary in order to provide the required intensity of service to ensure the patient's safety. The  patient's presenting symptoms, physical exam findings, and initial radiographic and laboratory data in the context of their medical condition is felt to place them at decreased risk for further clinical deterioration. Furthermore, it is anticipated that the patient will be medically stable for discharge from the hospital within 2 midnights of admission.   Portions of this note were generated with Scientist, clinical (histocompatibility and immunogenetics). Dictation errors may occur despite best attempts at proofreading.  Author: Mandy Second, MD 04/26/2024 9:34 AM  For on call review [email or web address].

## 2024-04-26 NOTE — Hospital Course (Signed)
 ASA and plavix for 3 months In 2 months, repeat CT angio head and neck

## 2024-04-27 ENCOUNTER — Other Ambulatory Visit (HOSPITAL_COMMUNITY)

## 2024-04-27 ENCOUNTER — Observation Stay (HOSPITAL_BASED_OUTPATIENT_CLINIC_OR_DEPARTMENT_OTHER)

## 2024-04-27 DIAGNOSIS — E785 Hyperlipidemia, unspecified: Secondary | ICD-10-CM

## 2024-04-27 DIAGNOSIS — I779 Disorder of arteries and arterioles, unspecified: Secondary | ICD-10-CM

## 2024-04-27 DIAGNOSIS — I651 Occlusion and stenosis of basilar artery: Secondary | ICD-10-CM | POA: Diagnosis not present

## 2024-04-27 DIAGNOSIS — E119 Type 2 diabetes mellitus without complications: Secondary | ICD-10-CM | POA: Diagnosis not present

## 2024-04-27 DIAGNOSIS — I10 Essential (primary) hypertension: Secondary | ICD-10-CM

## 2024-04-27 LAB — GLUCOSE, CAPILLARY
Glucose-Capillary: 136 mg/dL — ABNORMAL HIGH (ref 70–99)
Glucose-Capillary: 190 mg/dL — ABNORMAL HIGH (ref 70–99)
Glucose-Capillary: 128 mg/dL — ABNORMAL HIGH (ref 70–99)

## 2024-04-27 LAB — LIPID PANEL
Cholesterol: 167 mg/dL (ref 0–200)
HDL: 45 mg/dL (ref >40)
LDL Cholesterol: 102 mg/dL — ABNORMAL HIGH (ref 0–99)
Total CHOL/HDL Ratio: 3.7 ratio
Triglycerides: 102 mg/dL (ref <150)
VLDL: 20 mg/dL (ref 0–40)

## 2024-04-27 LAB — ECHOCARDIOGRAM COMPLETE
AR max vel: 1.88 cm2
AV Peak grad: 9.6 mmHg
Ao pk vel: 1.55 m/s
Area-P 1/2: 4.39 cm2
Height: 60.984 in
S' Lateral: 2.3 cm
Weight: 3774.28 [oz_av]

## 2024-04-27 MED ORDER — METFORMIN HCL 500 MG PO TABS: 500.0000 mg | ORAL_TABLET | Freq: Two times a day (BID) | ORAL | 0 refills | Status: AC

## 2024-04-27 MED ORDER — ROSUVASTATIN CALCIUM 20 MG PO TABS
20.0000 mg | ORAL_TABLET | Freq: Every day | ORAL | Status: DC
  Administered 2024-04-27: 20 mg via ORAL
  Filled 2024-04-27: qty 1

## 2024-04-27 MED ORDER — ASPIRIN 81 MG PO TBEC: 81.0000 mg | DELAYED_RELEASE_TABLET | Freq: Every day | ORAL | 12 refills | Status: AC

## 2024-04-27 MED ORDER — ALUM & MAG HYDROXIDE-SIMETH 200-200-20 MG/5ML PO SUSP
30.0000 mL | Freq: Four times a day (QID) | ORAL | Status: DC | PRN
  Administered 2024-04-27: 30 mL via ORAL
  Filled 2024-04-27: qty 30

## 2024-04-27 MED ORDER — CLOPIDOGREL BISULFATE 75 MG PO TABS: 75.0000 mg | ORAL_TABLET | Freq: Every day | ORAL | 0 refills | Status: DC

## 2024-04-27 MED ORDER — AMLODIPINE BESYLATE 5 MG PO TABS: 5.0000 mg | ORAL_TABLET | Freq: Every day | ORAL | 11 refills | Status: AC

## 2024-04-27 MED ORDER — ROSUVASTATIN CALCIUM 20 MG PO TABS: 20.0000 mg | ORAL_TABLET | Freq: Every day | ORAL | 0 refills | Status: DC

## 2024-04-27 NOTE — Discharge Summary (Signed)
 Physician Discharge Summary  Diane Reese WGN:[id number] DOB: September 01, 1971 DOA: 04/25/2024  PCP: Mayer Speaker, PA  Admit date: 04/25/2024 Discharge date: 04/27/2024  Admitted From: Home Disposition: Home  Recommendations for Outpatient Follow-up:  Follow up with PCP in 1-2 weeks Please obtain BMP/CBC in one week Outpatient neurology follow-up, referral sent  Home Health: N/A Equipment/Devices: N/A  Discharge Condition: Stable CODE STATUS: Full code Diet recommendation: Low-salt diet  Discharge summary: 53 year old female with history of hypertension, hyperlipidemia, type 2 diabetes who presented to the ER with acute onset of dizziness nausea and vomiting when she woke up with the symptoms.  In the emergency room blood pressure is elevated otherwise neurologically stable.  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  Admitted with neurology consultation.  MRI brain was essentially normal.  Echocardiogram with normal ejection fraction, no evidence of intracardiac thrombus.  Her symptoms quickly improved.  Neurology recommended to discharge patient on dual antiplatelet therapy, Crestor 20 mg daily, blood pressure medication started amlodipine 5 mg daily. LDL 102, started on Crestor Hemoglobin A1c 7.6, started on metformin. Discussed diet exercise and lifestyle modifications.  Stable for discharge.  Follow-up with neurology and she will likely need repeat CT angiogram.   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[facility])    Discharge Instructions  Discharge Instructions     Ambulatory referral to Neurology   Complete by: As directed    An appointment is requested in approximately: 4 weeks   Diet - low sodium heart healthy   Complete by: As directed    Diet Carb Modified   Complete by: As directed    Increase activity slowly   Complete by: As directed       Allergies as of  04/27/2024       Reactions   Penicillins Hives        Medication List     STOP taking these medications    acetaZOLAMIDE  ER 500 MG capsule Commonly known as: DIAMOX    albuterol  108 (90 Base) MCG/ACT inhaler Commonly known as: VENTOLIN  HFA   azithromycin  250 MG tablet Commonly known as: ZITHROMAX    benzonatate  100 MG capsule Commonly known as: TESSALON    cyclobenzaprine  10 MG tablet Commonly known as: FLEXERIL    lidocaine  5 % Commonly known as: Lidoderm    methylPREDNISolone  4 MG Tbpk tablet Commonly known as: MEDROL  DOSEPAK   naproxen  500 MG tablet Commonly known as: NAPROSYN    Phenyleph-Carbetapentane-GG 10-25-400 MG Caps       TAKE these medications    amLODipine 5 MG tablet Commonly known as: NORVASC Take 1 tablet (5 mg total) by mouth daily.   aspirin EC 81 MG tablet Take 1 tablet (81 mg total) by mouth daily. Swallow whole. Start taking on: Apr 28, 2024   clopidogrel 75 MG tablet Commonly known as: PLAVIX Take 1 tablet (75 mg total) by mouth daily. Start taking on: Apr 28, 2024   metFORMIN 500 MG tablet Commonly known as: GLUCOPHAGE Take 1 tablet (500 mg total) by mouth 2 (two) times daily with a meal. Start taking on: Apr 28, 2024   rosuvastatin 20 MG tablet Commonly known as: CRESTOR Take 1 tablet (20 mg total) by mouth daily.        Allergies  Allergen Reactions   Penicillins Hives    Consultations: Neurology   Procedures/Studies: MR BRAIN WO CONTRAST Result Date: 04/26/2024 CLINICAL DATA:  Initial evaluation for acute neuro deficit,  stroke suspected. EXAM: MRI HEAD WITHOUT CONTRAST TECHNIQUE: Multiplanar, multiecho pulse sequences of the brain and surrounding structures were obtained without intravenous contrast. COMPARISON:  Comparison made with prior CTA from earlier the same day. FINDINGS: Brain: Cerebral volume within normal limits. No significant cerebral white matter disease or other focal parenchymal signal abnormality. No  evidence for acute or subacute infarct. Gray-white matter differentiation well maintained. No evidence for chronic cortical infarction or other insult. No acute or chronic intracranial blood products. No mass lesion, midline shift or mass effect. No hydrocephalus or extra-axial fluid collection. Partially empty sella noted. Vascular: Major intracranial vascular flow voids are maintained. No appreciable signal abnormality seen within the basilar artery correspond with finding on prior CTA. Skull and upper cervical spine: Craniocervical junction within normal limits. Bone marrow signal intensity normal. No scalp soft tissue abnormality. Sinuses/Orbits: Globes orbital soft tissues within normal limits. Paranasal sinuses are largely clear. No significant mastoid effusion. Other: None. IMPRESSION: Normal brain MRI. No acute intracranial infarct or other abnormality. Electronically Signed   By: Virgia Griffins M.D.   On: 04/26/2024 17:59   CT ANGIO HEAD NECK W WO CM Result Date: 04/26/2024 CLINICAL DATA:  Initial evaluation for neuro deficit, stroke suspected. EXAM: CT ANGIOGRAPHY HEAD AND NECK WITH AND WITHOUT CONTRAST TECHNIQUE: Multidetector CT imaging of the head and neck was performed using the standard protocol during bolus administration of intravenous contrast. Multiplanar CT image reconstructions and MIPs were obtained to evaluate the vascular anatomy. Carotid stenosis measurements (when applicable) are obtained utilizing NASCET criteria, using the distal internal carotid diameter as the denominator. RADIATION DOSE REDUCTION: This exam was performed according to the departmental dose-optimization program which includes automated exposure control, adjustment of the mA and/or kV according to patient size and/or use of iterative reconstruction technique. CONTRAST:  75mL OMNIPAQUE IOHEXOL 350 MG/ML SOLN COMPARISON:  CT from 07/26/2021 FINDINGS: CT HEAD FINDINGS Brain: Cerebral volume within normal limits for  patient age. No acute intracranial hemorrhage. No acute large vessel territory infarct. No mass lesion, midline shift, or mass effect. Ventricles are normal in size without hydrocephalus. No extra-axial fluid collection. Vascular: No abnormal hyperdense vessel. Skull: Scalp soft tissues demonstrate no acute abnormality. Calvarium intact. Sinuses/Orbits: Globes and orbital soft tissues within normal limits. Visualized paranasal sinuses are largely clear. No significant mastoid effusion. CTA NECK FINDINGS Aortic arch: Standard branching. Imaged portion shows no evidence of aneurysm or dissection. No significant stenosis of the major arch vessel origins. Right carotid system: No evidence of dissection, stenosis (50% or greater), or occlusion. Left carotid system: No evidence of dissection, stenosis (50% or greater), or occlusion. Vertebral arteries: No evidence of dissection, stenosis (50% or greater), or occlusion. Skeleton: No worrisome osseous lesions. Other neck: No other acute finding. Upper chest: No other acute finding. Review of the MIP images confirms the above findings CTA HEAD FINDINGS Anterior circulation: Both internal carotid arteries are widely patent through the siphons without stenosis. A1 segments, anterior communicating artery complex, and anterior cerebral arteries patent without stenosis. No M1 stenosis or occlusion. Distal MCA branches perfused and symmetric. Posterior circulation: Both V4 segments patent without stenosis. Right vertebral artery dominant. Both PICA patent. Focal intimal irregularity and filling defect noted involving the posterior aspect of the mid basilar artery (series 19, image 193). Secondary mild stenosis at this level. Basilar widely patent distally. Superior cerebral arteries patent bilaterally. Both PCAs primarily supplied via the basilar well perfused to their distal aspects without stenosis. Venous sinuses: Patent allowing for timing the contrast  bolus. Anatomic  variants: None significant.  No aneurysm. Review of the MIP images confirms the above findings IMPRESSION: 1. Focal intimal irregularity and filling defect involving the mid basilar artery as above. Finding suspicious for short-segment dissection with intraluminal thrombus. Secondary mild stenosis at this level. A short-segment fenestration is considered, but felt to be less likely given the eccentric nature of this finding. 2. Otherwise negative CTA of the head and neck. No other large vessel occlusion, hemodynamically significant stenosis, or other abnormality. Critical Value/emergent results were called by telephone at the time of interpretation on 04/26/2024 at 2:49 am to provider [facility] , who verbally acknowledged these results. Electronically Signed   By: Virgia Griffins M.D.   On: 04/26/2024 02:53   (Echo, Carotid, EGD, Colonoscopy, ERCP)    Subjective: Patient was seen in the morning rounds.  She was discharged in the evening.  She had no complaints.  Detailed discussion about medication management and lifestyle changes. Patient was called and updated about her echocardiogram results.  Patient reported doing well today.   Discharge Exam: Vitals:   04/27/24 1210 04/27/24 1622  BP: (!) 167/99 (!) 157/90  Pulse: 92 85  Resp: 17 17  Temp: 98.6 F (37 C) 98.4 F (36.9 C)  SpO2: 99% 100%   Vitals:   04/27/24 0721 04/27/24 0900 04/27/24 1210 04/27/24 1622  BP: (!) 160/116 (!) 153/94 (!) 167/99 (!) 157/90  Pulse: 86 93 92 85  Resp: 17  17 17   Temp: 98.7 F (37.1 C)  98.6 F (37 C) 98.4 F (36.9 C)  TempSrc: Oral  Oral Oral  SpO2: 100% 98% 99% 100%  Weight:      Height:        General: Pt is alert, awake, not in acute distress Cardiovascular: RRR, S1/S2 +, no rubs, no gallops Respiratory: CTA bilaterally, no wheezing, no rhonchi Abdominal: Soft, NT, ND, bowel sounds + Extremities: no edema, no cyanosis    The results of significant diagnostics from this  hospitalization (including imaging, microbiology, ancillary and laboratory) are listed below for reference.     Microbiology: No results found for this or any previous visit (from the past 240 hours).   Labs: BNP (last 3 results) No results for input(s): "BNP" in the last 8760 hours. Basic Metabolic Panel: Recent Labs  Lab 04/25/24 2052 04/26/24 1204  NA 139 140  K 3.4* 3.2*  CL 101 104  CO2 26 26  GLUCOSE 175* 210*  BUN 12 9  CREATININE 0.84 0.74  CALCIUM 9.4 8.7*   Liver Function Tests: Recent Labs  Lab 04/25/24 2052  AST 16  ALT 11  ALKPHOS 111  BILITOT 0.2  PROT 7.6  ALBUMIN 4.0   No results for input(s): "LIPASE", "AMYLASE" in the last 168 hours. No results for input(s): "AMMONIA" in the last 168 hours. CBC: Recent Labs  Lab 04/25/24 2052 04/26/24 1204  WBC 12.6* 10.8*  HGB 14.0 13.2  HCT 43.6 40.8  MCV 85.3 85.4  PLT 332 303   Cardiac Enzymes: No results for input(s): "CKTOTAL", "CKMB", "CKMBINDEX", "TROPONINI" in the last 168 hours. BNP: Invalid input(s): "POCBNP" CBG: Recent Labs  Lab 04/26/24 1640 04/26/24 2140 04/27/24 0643 04/27/24 1212 04/27/24 1623  GLUCAP 195* 141* 136* 190* 128*   D-Dimer No results for input(s): "DDIMER" in the last 72 hours. Hgb A1c Recent Labs    04/26/24 1204  HGBA1C 7.6*   Lipid Profile Recent Labs    04/27/24 0712  CHOL 167  HDL 45  LDLCALC 102*  TRIG 102  CHOLHDL 3.7   Thyroid function studies No results for input(s): "TSH", "T4TOTAL", "T3FREE", "THYROIDAB" in the last 72 hours.  Invalid input(s): "FREET3" Anemia work up No results for input(s): "VITAMINB12", "FOLATE", "FERRITIN", "TIBC", "IRON", "RETICCTPCT" in the last 72 hours. Urinalysis    Component Value Date/Time   COLORURINE YELLOW 04/25/2024 2051   APPEARANCEUR CLEAR 04/25/2024 2051   LABSPEC 1.015 04/25/2024 2051   PHURINE 6.5 04/25/2024 2051   GLUCOSEU NEGATIVE 04/25/2024 2051   HGBUR NEGATIVE 04/25/2024 2051   BILIRUBINUR  NEGATIVE 04/25/2024 2051   KETONESUR NEGATIVE 04/25/2024 2051   PROTEINUR NEGATIVE 04/25/2024 2051   NITRITE NEGATIVE 04/25/2024 2051   LEUKOCYTESUR NEGATIVE 04/25/2024 2051   Sepsis Labs Recent Labs  Lab 04/25/24 2052 04/26/24 1204  WBC 12.6* 10.8*   Microbiology No results found for this or any previous visit (from the past 240 hours).   Time coordinating discharge:  32 minutes  SIGNED:   Vada Garibaldi, MD  [facility] 04/27/2024, 5:45 PM

## 2024-04-27 NOTE — Progress Notes (Addendum)
 STROKE TEAM PROGRESS NOTE    INTERIM HISTORY/SUBJECTIVE  Patient has remained afebrile and hemodynamically stable.  She reports that her vertigo, nausea and vomiting have resolved.  OBJECTIVE  CBC    Component Value Date/Time   WBC 10.8 (H) 04/26/2024 1204   RBC 4.78 04/26/2024 1204   HGB 13.2 04/26/2024 1204   HCT 40.8 04/26/2024 1204   PLT 303 04/26/2024 1204   MCV 85.4 04/26/2024 1204   MCH 27.6 04/26/2024 1204   MCHC 32.4 04/26/2024 1204   RDW 12.5 04/26/2024 1204   LYMPHSABS 2.2 02/06/2023 1538   MONOABS 0.5 02/06/2023 1538   EOSABS 0.4 02/06/2023 1538   BASOSABS 0.0 02/06/2023 1538    BMET    Component Value Date/Time   NA 140 04/26/2024 1204   K 3.2 (L) 04/26/2024 1204   CL 104 04/26/2024 1204   CO2 26 04/26/2024 1204   GLUCOSE 210 (H) 04/26/2024 1204   BUN 9 04/26/2024 1204   CREATININE 0.74 04/26/2024 1204   CALCIUM 8.7 (L) 04/26/2024 1204   GFRNONAA >60 04/26/2024 1204    IMAGING past 24 hours MR BRAIN WO CONTRAST Result Date: 04/26/2024 CLINICAL DATA:  Initial evaluation for acute neuro deficit, stroke suspected. EXAM: MRI HEAD WITHOUT CONTRAST TECHNIQUE: Multiplanar, multiecho pulse sequences of the brain and surrounding structures were obtained without intravenous contrast. COMPARISON:  Comparison made with prior CTA from earlier the same day. FINDINGS: Brain: Cerebral volume within normal limits. No significant cerebral white matter disease or other focal parenchymal signal abnormality. No evidence for acute or subacute infarct. Gray-white matter differentiation well maintained. No evidence for chronic cortical infarction or other insult. No acute or chronic intracranial blood products. No mass lesion, midline shift or mass effect. No hydrocephalus or extra-axial fluid collection. Partially empty sella noted. Vascular: Major intracranial vascular flow voids are maintained. No appreciable signal abnormality seen within the basilar artery correspond with finding  on prior CTA. Skull and upper cervical spine: Craniocervical junction within normal limits. Bone marrow signal intensity normal. No scalp soft tissue abnormality. Sinuses/Orbits: Globes orbital soft tissues within normal limits. Paranasal sinuses are largely clear. No significant mastoid effusion. Other: None. IMPRESSION: Normal brain MRI. No acute intracranial infarct or other abnormality. Electronically Signed   By: Virgia Griffins M.D.   On: 04/26/2024 17:59    Vitals:   04/26/24 2335 04/27/24 0338 04/27/24 0721 04/27/24 0900  BP: (!) 162/91 (!) 159/97 (!) 160/116 (!) 153/94  Pulse: 76 83 86 93  Resp: 19 18 17    Temp: 98.3 F (36.8 C) 98.3 F (36.8 C) 98.7 F (37.1 C)   TempSrc: Oral Oral Oral   SpO2: 99% 96% 100% 98%  Weight:      Height:         PHYSICAL EXAM General:  Alert, well-nourished, well-developed patient in no acute distress Psych:  Mood and affect appropriate for situation CV: Regular rate and rhythm on monitor Respiratory:  Regular, unlabored respirations on room air GI: Abdomen soft and nontender   NEURO:  Mental Status: AA&Ox3, patient is able to give clear and coherent history Speech/Language: speech is without dysarthria or aphasia.    Cranial Nerves:  II: PERRL. Visual fields full.  III, IV, VI: EOMI. Eyelids elevate symmetrically.  V: Sensation is intact to light touch and symmetrical to face.  VII: Face is symmetrical resting and smiling VIII: hearing intact to voice. IX, X: Phonation is normal.  XB:[id number] shrug 5/5. XII: tongue is midline without fasciculations. Motor: 5/5 strength to  all muscle groups tested.  Tone: is normal and bulk is normal Sensation- Intact to light touch bilaterally.  Coordination: FTN intact bilaterally Gait- deferred  Most Recent NIH 0   ASSESSMENT/PLAN  [name] is a 53 y.o. female with history of tension, hyperlipidemia and diabetes admitted for acute onset dizziness, nausea and vomiting.  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  MRI was negative for stroke.  Patient will be placed on DAPT for at least 3 months and will need to follow-up CTA in 2 months to evaluate dissection NIH on Admission 0  Basilar artery dissection CT head No acute abnormality.  CTA head & neck focal intimal irregularity and filling defect involving mid basilar artery suspicious for short segment dissection with intraluminal thrombus MRI no acute abnormality 2D Echo pending LDL 102 HgbA1c 7.6 VTE prophylaxis -Lovenox No antithrombotic prior to admission, now on aspirin 81 mg daily and clopidogrel 75 mg daily for 3 months, then to be determined by CT angiogram results Therapy recommendations:  No follow up needed  Disposition: Pending, likely home  Hypertension Home meds: None Stable Maintain normotension  Hyperlipidemia Home meds: None LDL 102, goal < 100 Add rosuvastatin 20 mg daily Continue statin at discharge  Diabetes type II Uncontrolled Home meds: None HgbA1c 7.6, goal < 7.0 CBGs SSI Recommend close follow-up with PCP for better DM control   Other Stroke Risk Factors Obesity, Body mass index is 44.59 kg/m., BMI >/= 30 associated with increased stroke risk, recommend weight loss, diet and exercise as appropriate    Other Active Problems None  Hospital day # 0  Patient seen by NP and then by MD, MD to edit note as needed. 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  MRI is negative for stroke.  Symptoms have resolved.  Workup is completed except for echo still pending.  On DAPT therapy for 3 months will need repeat imaging prior to that.  Close follow-up outpatient.  If echo is unremarkable we will sign off.  Please call  questions.    Dr. Dahlia Dross evaluated pt independently, reviewed imaging, chart, labs. Discussed and formulated plan with the Resident/APP. Changes were made to the note where appropriate. Please see APP/resident note above for details.   Total 36 minutes spent on counseling patient and coordinating care, writing notes and reviewing chart.   Maxcine Strong,MD     To contact Stroke Continuity provider, please refer to [email or web address]. After hours, contact General Neurology

## 2024-04-27 NOTE — Progress Notes (Signed)
 OT Cancellation Note  Patient Details Name: Diane Reese MRN: [id number] DOB: 09/09/1971   Cancelled Treatment:    Reason Eval/Treat Not Completed: OT screened, no needs identified, will sign off. Spoke with PT who reports pt is back at her prior baseline performing ADL independently and pt has been educated regarding BEFAST as well as compensatory techniques for dizziness.   Karilyn Ouch, OTR/L [facility] Office: [phone]   Diane Reese 04/27/2024, 6:56 AM

## 2024-04-27 NOTE — Progress Notes (Signed)
 Patient ready for discharge to home; discharge instructions given and reviewed; Rx sent electronically; patient dressed and discharged out via wheelchair; accompanied home by her family member.

## 2024-04-27 NOTE — Progress Notes (Signed)
 Echocardiogram 2D Echocardiogram has been performed.  Diane Reese 04/27/2024, 5:54 PM

## 2024-04-29 ENCOUNTER — Telehealth: Payer: Self-pay | Admitting: Neurology

## 2024-04-29 NOTE — Telephone Encounter (Signed)
 Pt was returning call

## 2024-06-18 ENCOUNTER — Encounter: Payer: Self-pay | Admitting: Neurology

## 2024-06-18 ENCOUNTER — Ambulatory Visit: Payer: Self-pay | Admitting: Neurology

## 2024-06-18 VITALS — BP 150/90 | HR 93 | Resp 14 | Ht 61.0 in | Wt 235.0 lb

## 2024-06-18 DIAGNOSIS — M79604 Pain in right leg: Secondary | ICD-10-CM

## 2024-06-18 DIAGNOSIS — I779 Disorder of arteries and arterioles, unspecified: Secondary | ICD-10-CM | POA: Diagnosis not present

## 2024-06-18 DIAGNOSIS — I7775 Dissection of other precerebral arteries: Secondary | ICD-10-CM | POA: Diagnosis not present

## 2024-06-18 NOTE — Patient Instructions (Addendum)
 Continue with Aspirin  and Plavix  for total of 3 months and then continue with aspirin /lifelong Will repeat CT angiogram head Due to complaint of her right calf pain, will obtain a ultrasound of the lower extremity I will contact you to go over the results.  Continue to follow with PCP Return as needed.

## 2024-06-18 NOTE — Progress Notes (Signed)
 [facility]  PATIENT: Diane Reese DOB: [birth year]  REQUESTING CLINICIAN: Raenelle Coria, MD HISTORY FROM: Patient/Chart review  REASON FOR VISIT: Basilar artery occlusion    HISTORICAL  CHIEF COMPLAINT:  Chief Complaint  Patient presents with   New Patient (Initial Visit)    Rm13, alone, internal referral for Disorder of basilar artery: pt stated that she had no symptoms to report just wants to be rechecked since previous blot clot in brain    HISTORY OF PRESENT ILLNESS:  This is a 53 year old woman past medical history hypertension, hyperlipidemia, diabetes mellitus, obesity who is presenting after being seen in the hospital 2 months ago for acute dizziness.  Patient presented to the hospital with symptoms of dizziness that she described as a room spinning sensation, leaning towards the left when walking with nausea and vomiting.  Her brain MRI did not show any acute stroke but her angiogram showed a basilar artery dissection.  Patient was treated with DAPT, and her symptoms resolved within 24 hours.  She has been on aspirin  and Plavix , no side effect and understands to take this medication for total of 3 months.  Patient also understands that she should continue with aspirin  lifelong.  Currently she does not have any additional complaints other than the right calf pain.  Denies any previous history of blood clot, PE, and no previous history of multiple miscarriages.    OTHER MEDICAL CONDITIONS: Hypertension, Hyperlipidemia, Diabetes, Obesity    REVIEW OF SYSTEMS: Full 14 system review of systems performed and negative with exception of: As noted in the HPI   ALLERGIES: Allergies  Allergen Reactions   Penicillins Hives    HOME MEDICATIONS: Outpatient Medications Prior to Visit  Medication Sig Dispense Refill   amLODipine  (NORVASC ) 5 MG tablet Take 1 tablet (5 mg total) by mouth daily. 30 tablet 11   aspirin  EC 81 MG tablet Take 1 tablet (81 mg total) by  mouth daily. Swallow whole. 30 tablet 12   clopidogrel  (PLAVIX ) 75 MG tablet Take 1 tablet (75 mg total) by mouth daily. 90 tablet 0   metFORMIN  (GLUCOPHAGE ) 500 MG tablet Take 1 tablet (500 mg total) by mouth 2 (two) times daily with a meal. 180 tablet 0   rosuvastatin  (CRESTOR ) 20 MG tablet Take 1 tablet (20 mg total) by mouth daily. 90 tablet 0   No facility-administered medications prior to visit.    PAST MEDICAL HISTORY: History reviewed. No pertinent past medical history.  PAST SURGICAL HISTORY: Past Surgical History:  Procedure Laterality Date   ABDOMINAL HYSTERECTOMY      FAMILY HISTORY: Family History  Problem Relation Age of Onset   Stroke Sister     SOCIAL HISTORY: Social History   Socioeconomic History   Marital status: Married    Spouse name: Not on file   Number of children: Not on file   Years of education: Not on file   Highest education level: Not on file  Occupational History   Not on file  Tobacco Use   Smoking status: Never   Smokeless tobacco: Never  Substance and Sexual Activity   Alcohol use: No   Drug use: No   Sexual activity: Yes    Birth control/protection: Surgical  Other Topics Concern   Not on file  Social History Narrative   Not on file   Social Drivers of Health   Financial Resource Strain: Low Risk  (05/14/2024)   Received [facility]   Overall Financial Resource Strain (CARDIA)  Difficulty of Paying Living Expenses: Not hard at all  Food Insecurity: No Food Insecurity (05/14/2024)   Received from [facility]   Hunger Vital Sign    Within the past 12 months, you worried that your food would run out before you got the money to buy more.: Never true    Within the past 12 months, the food you bought just didn't last and you didn't have money to get more.: Never true  Transportation Needs: No Transportation Needs (05/14/2024)   Received from [facility]   PRAPARE - Transportation    Lack of Transportation (Medical):  No    Lack of Transportation (Non-Medical): No  Recent Concern: Transportation Needs - Unmet Transportation Needs (04/26/2024)   PRAPARE - Administrator, Civil Service (Medical): Yes    Lack of Transportation (Non-Medical): Yes  Physical Activity: Not on file  Stress: Not on file  Social Connections: Unknown (04/17/2022)   Received from [facility]   Social Network    Social Network: Not on file  Intimate Partner Violence: Not At Risk (04/26/2024)   Humiliation, Afraid, Rape, and Kick questionnaire    Fear of Current or Ex-Partner: No    Emotionally Abused: No    Physically Abused: No    Sexually Abused: No    PHYSICAL EXAM  GENERAL EXAM/CONSTITUTIONAL: Vitals:  Vitals:   06/18/24 0940 06/18/24 0941  BP: (!) 141/88 (!) 150/90  Pulse:  93  Resp:  14  SpO2:  95%  Weight:  235 lb (106.6 kg)  Height:  5' 1 (1.549 m)   Body mass index is 44.4 kg/m. Wt Readings from Last 3 Encounters:  06/18/24 235 lb (106.6 kg)  04/26/24 235 lb 14.3 oz (107 kg)  02/06/23 223 lb (101.2 kg)   Patient is in no distress; well developed, nourished and groomed; neck is supple  MUSCULOSKELETAL: Gait, strength, tone, movements noted in Neurologic exam below  NEUROLOGIC: MENTAL STATUS:      No data to display         awake, alert, oriented to person, place and time recent and remote memory intact normal attention and concentration language fluent, comprehension intact, naming intact fund of knowledge appropriate  CRANIAL NERVE:  2nd, 3rd, 4th, 6th - Visual fields full to confrontation, extraocular muscles intact, no nystagmus 5th - facial sensation symmetric 7th - facial strength symmetric 8th - hearing intact 9th - palate elevates symmetrically, uvula midline 11th - shoulder shrug symmetric 12th - tongue protrusion midline  MOTOR:  normal bulk and tone, full strength in the BUE, BLE. There is tenderness to palpation to the right calf   SENSORY:  normal and  symmetric to light touch  COORDINATION:  finger-nose-finger, fine finger movements normal  GAIT/STATION:  normal    DIAGNOSTIC DATA (LABS, IMAGING, TESTING) - I reviewed patient records, labs, notes, testing and imaging myself where available.  Lab Results  Component Value Date   WBC 10.8 (H) 04/26/2024   HGB 13.2 04/26/2024   HCT 40.8 04/26/2024   MCV 85.4 04/26/2024   PLT 303 04/26/2024      Component Value Date/Time   NA 140 04/26/2024 1204   K 3.2 (L) 04/26/2024 1204   CL 104 04/26/2024 1204   CO2 26 04/26/2024 1204   GLUCOSE 210 (H) 04/26/2024 1204   BUN 9 04/26/2024 1204   CREATININE 0.74 04/26/2024 1204   CALCIUM  8.7 (L) 04/26/2024 1204   PROT 7.6 04/25/2024 2052   ALBUMIN 4.0 04/25/2024 2052  AST 16 04/25/2024 2052   ALT 11 04/25/2024 2052   ALKPHOS 111 04/25/2024 2052   BILITOT 0.2 04/25/2024 2052   GFRNONAA >60 04/26/2024 1204   GFRAA >60 05/13/2017 1755   Lab Results  Component Value Date   CHOL 167 04/27/2024   HDL 45 04/27/2024   LDLCALC 102 (H) 04/27/2024   TRIG 102 04/27/2024   CHOLHDL 3.7 04/27/2024   Lab Results  Component Value Date   HGBA1C 7.6 (H) 04/26/2024   No results found for: VITAMINB12 No results found for: TSH  MRI Brain 04/26/2024 Normal brain MRI. No acute intracranial infarct or other abnormality.  CTA Head and Neck 04/26/2024 1. Focal intimal irregularity and filling defect involving the mid basilar artery as above. Finding suspicious for short-segment dissection with intraluminal thrombus. Secondary mild stenosis at this level. A short-segment fenestration is considered, but felt to be less likely given the eccentric nature of this finding. 2. Otherwise negative CTA of the head and neck. No other large vessel occlusion, hemodynamically significant stenosis, or other abnormality    ASSESSMENT AND PLAN  53 y.o. 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  She has been treated with DAPT, Aspirin  and Plavix  and understands this is for total of 28-months and then she will continue with aspirin  alone.  Due to ongoing complaint of right calf pain, I will also obtain an ultrasound of the bilateral lower extremity to rule out DVT.  I will contact her to go over the result.  Advised her to continue following up with PCP and return as needed or if symptoms are worse.  1. Disorder of basilar artery (HCC)   2. Dissection of basilar artery (HCC)      Patient Instructions  Continue with aspirin  and Plavix  for total of 3 months and then continuing aspirin /lifelong Will repeat CT angiogram head Due to complaint of her right calf pain, will obtain a ultrasound of the lower extremity I will contact you to go over the results.  Continue to follow with PCP Return as needed.  No orders of the defined types were placed in this encounter.   No orders of the defined types were placed in this encounter.   Return if symptoms worsen or fail to improve.    Pastor Falling, MD 06/18/2024, 10:20 AM  [facility] [address] [place], [place] [address] [phone]

## 2024-06-19 ENCOUNTER — Telehealth: Payer: Self-pay | Admitting: Neurology

## 2024-06-19 NOTE — Telephone Encounter (Signed)
 sent to GI they obtain Diane Reese [phone]

## 2024-06-23 ENCOUNTER — Ambulatory Visit: Payer: Self-pay | Admitting: Neurology

## 2024-06-23 ENCOUNTER — Ambulatory Visit (HOSPITAL_COMMUNITY)
Admission: RE | Admit: 2024-06-23 | Discharge: 2024-06-23 | Disposition: A | Discharge status: Home / Self Care | Payer: Self-pay | Source: Ambulatory Visit | Attending: Neurology | Admitting: Neurology

## 2024-06-23 DIAGNOSIS — I779 Disorder of arteries and arterioles, unspecified: Secondary | ICD-10-CM

## 2024-06-23 DIAGNOSIS — M79604 Pain in right leg: Secondary | ICD-10-CM | POA: Insufficient documentation

## 2024-06-30 ENCOUNTER — Ambulatory Visit
Admission: RE | Admit: 2024-06-30 | Discharge: 2024-06-30 | Disposition: A | Discharge status: Home / Self Care | Payer: Self-pay | Source: Ambulatory Visit | Attending: Neurology

## 2024-06-30 DIAGNOSIS — I7775 Dissection of other precerebral arteries: Secondary | ICD-10-CM

## 2024-06-30 DIAGNOSIS — I779 Disorder of arteries and arterioles, unspecified: Secondary | ICD-10-CM

## 2024-06-30 MED ORDER — IOPAMIDOL (ISOVUE-370) INJECTION 76%
75.0000 mL | Freq: Once | INTRAVENOUS | Status: AC | PRN
  Administered 2024-06-30: 75 mL via INTRAVENOUS

## 2024-07-02 NOTE — Telephone Encounter (Signed)
 Pt called to request to speak to MD about Results. Pt would like  MD to call to explain results

## 2024-07-08 NOTE — Telephone Encounter (Signed)
 MRA head order sent to GI [phone]

## 2024-07-21 ENCOUNTER — Other Ambulatory Visit: Payer: Self-pay | Admitting: Neurology

## 2024-07-21 MED ORDER — ALPRAZOLAM 1 MG PO TABS: 1.0000 mg | ORAL_TABLET | Freq: Every evening | ORAL | 0 refills | Status: AC | PRN

## 2024-07-21 NOTE — Addendum Note (Signed)
 Addended by: Jaxson Keener L on: 07/21/2024 03:54 PM   Modules accepted: Orders

## 2024-08-03 ENCOUNTER — Ambulatory Visit
Admission: RE | Admit: 2024-08-03 | Discharge: 2024-08-03 | Disposition: A | Discharge status: Home / Self Care | Payer: Self-pay | Source: Ambulatory Visit | Attending: Neurology | Admitting: Neurology

## 2024-08-03 DIAGNOSIS — I779 Disorder of arteries and arterioles, unspecified: Secondary | ICD-10-CM | POA: Diagnosis not present

## 2024-08-04 ENCOUNTER — Ambulatory Visit: Payer: Self-pay

## 2024-08-04 ENCOUNTER — Telehealth: Payer: Self-pay

## 2024-08-04 NOTE — Telephone Encounter (Signed)
 Reviewed preliminary result with patient advised final result would given when Dr. camera returned and reviewed. Patient verbalized understanding.

## 2024-11-12 NOTE — ED Provider Notes (Signed)
 [facility] Emergency Department Provider Note  History and Review of Systems  Diane Reese is a 53 y.o. year-old female with a medical history discussed below presenting to the ED with chief complaint of dizziness.  Patient reports that this has been present for the last 24 hours at least, started around 9 AM yesterday.  Associated with ear fullness bilaterally, face fullness mostly on the right side, patient has had no rhinorrhea, known sick contacts, no other infectious type symptoms.  Reports that the last time she had dizziness, described as head spinning and room spinning, she was diagnosed with a stroke this was back in April 2025.  Reports that she had 2 episodes of nonbloody nonbilious emesis, 1 episode of nonbloody diarrhea, this was very similar to her previous presentation as well.  Denies chest pain, shortness of breath, abdominal pain, nausea, fever, urinary symptoms, blurry vision/diplopia.   Reviewed discharge summary, the patient was admitted 04/26/1999 25 through 5/11/July 25.  Patient was admitted for a basilar artery short segment dissection with intraluminal thrombus.  Admitted with neurology consultation, MRI brain overall essentially normal, echocardiogram normal ejection fraction with no intracardiac thrombus, symptoms improved, neurology recommended to discharge on dual antiplatelet therapy.   Additional history obtained from: Prior ED/Urgent Care Note   Review of Systems A pertinent review of systems was obtained and was negative except as noted in the HPI and MDM.  Past Medical History:  Penicillins  Medical History[1]  Surgical History[2] Family History[3]  Social History[4]  Home Medications: Current Outpatient Medications  Medication Instructions   ibuprofen  (MOTRIN ) 200 mg tablet Take  by mouth.   levocetirizine (XYZAL) 5 mg   promethazine-dextromethorphan (PHENERGAN DM) 6.25-15 mg/5 mL syrp syrup 5 mL, oral, 4 times daily PRN    Physical Exam  Gen:  A&Ox4, NAD.  HEENT: Normocephalic and atraumatic, EOMI, not icteric. Moist mucous membranes.  Neck: Supple, full range of motion, no observable masses.  Lungs: No Respiratory distress. Lungs CTA BL. CV: RRR, no audible heart murmur. No peripheral edema. Abdomen: Soft, nontender, nondistended.  MSK: No obvious deformities, no joint swelling.  Skin: No rashes, petechiae, no notable lesions. Normal color per patient.  Neuro: Strength 5/5 and equal BL in the upper/lower extremities. Sensation grossly intact and equal BL in the upper/lower extremities. CN II-XII grossly intact. PERRLA, 5mm. Finger to nose and heel to shin intact. Ambulation without gait disturbance. No nystagmus. Reflexes: 2+.  Psych: Appropriate for situation.   Medical Decision Making  Visit Vitals BP (!) 160/83  Pulse 100  Temp 98 F (36.7 C) (Oral)  Resp 16  SpO2 97%    Assessment/Impression/DDx: Briefly, Diane Reese is a 53 y.o. female with a past medical history as noted above who presents with dizziness. Initial vitals as above. The differential diagnosis for dizziness is broad and includes etiologies such as stroke (especially posterior circulation, cerebellar, or brainstem infarction), carotid dissection, ACS, migraine, anemia, hypovolemia/orthostatic hypotension, dehydration, electrolyte abnormalities, metabolic abnormalities, acute infectious etiology (including viral illness, AOM), multiple sclerosis, mass effect/malignancy (acoustic neuroma, cerebellar tumor), respiratory etiology/hyperventilation, medication side effect, HTN emergency, carotid sinus hypersensitivity, post-concussive syndrome, trauma, vertigo symptoms (including BPPV, meniere's), labyrinthitis, vestibular neuritis, among other etiologies. Clinical presentation most consistent with recurrence of posterior stroke vs dissection. Stroke is a critical consideration, particularly posterior circulation involvement. The patients exam is reassuring without  focal neurologic deficits, dysmetria, or abnormal gait, making central cause less likely. Will obtain CTA head and neck. Will reassess frequently for any evolving signs  concerning for central process. Neurology consulted as needed.    Patient reassessment and disposition details: ED Course as of 11/12/24 1844  Wed Nov 12, 2024  1006 Lab work generally reassuring [SG]  1007 EKG Independent interpretation of EKG sinus rhythm at a rate of 85, PR 162, QRS 86, QTc.  No ST elevation [SG]  1430 Radiology called for CTA read still pending, they will read shortly [SG]  1636 Re-evaluated the patient, she is feeling better but reports feeling hungry, still has mild dizziness only when moving her head but would like to try eating.  We discussed that if her symptoms do not get significantly better, we could pursue an MRI and/or admission.  Patient would like to try eating, ambulating, prior to deciding. [SG]  1734 Patient reports that she is feeling ready to be discharged home.  She reports only mild/minimal dizziness upon moving too quickly, she reports that this feels similar to previous episodes of vertigo, she still reporting some pressure under her right thigh, has wellness right ear fullness, I evaluated bilateral tympanic membranes and ears with no evidence of bulging erythema or concern for infection.  The patient could have underlying viral infection but there is no evidence or need for antibiotics.  The patient has no hearing loss therefor less likely labyrinthitis. Could be vestibular neuritis given vertigo symptoms without hearing loss and recent viral type symptoms. Reassuringly no focal deficits. No nystagmus on exam, therefor no indication or utility for HINTS exam.   As the patient is not feeling 100% improved she was offered MRI and/or admission for continued monitoring and symptomatic treatment, the patient declined at this time reports that she is feeling better and would like be discharged home,  was given strict return precautions.  Continues to have no focal deficits.  Vital signs stable, nontoxic-appearing. [SG]    ED Course User Index [SG] Reena Edna Flock, DO    Procedures   Procedures  ED MEDS: Medications  iohexoL  (OMNIPAQUE ) 350 mg iodine/mL injection (MDV) 80 mL (80 mL intravenous Given 11/12/24 1152)    Patient management required discussion with the following services or consulting groups: None   Patient's presentation is most consistent with acute presentation with potential threat to life or bodily function.   Factors Impacting ED Encounter Risk: Discussion regarding hospitalization Results  Labs:  Abnormal Labs Reviewed  COMPREHENSIVE METABOLIC PANEL - Abnormal; Notable for the following components:      Result Value   Glucose, Random 300 (*)    Aspartate Aminotransferase (AST) 11 (*)    All other components within normal limits  CBC WITH DIFFERENTIAL - Abnormal; Notable for the following components:   RBC 5.14 (*)    Mean Corpuscular Hemoglobin (MCH) 26.9 (*)    Mean Corpuscular Hemoglobin Conc (MCHC) 32.8 (*)    All other components within normal limits    Radiology:  CT Angio Head And Neck  Final Result by Elsie Glean Dec, MD (11/26 1702)  CT BRAIN WITH AND WITHOUT CONTRAST, CT ANGIOGRAPHY OF NECK AND HEAD,   11/12/2024 11:51 AM    INDICATION: prior basilar artery dissection, similar symptoms >24 hours     COMPARISON: CT head 06/09/2012 and reports from outside CTA head/neck   06/30/2024 and 04/26/2024    TECHNIQUE: Scout and pre-contrast axial images were first obtained   followed by a small test bolus of contrast for CTA timing purposes.   Thereafter, a full dose of iodinated contrast was administered   intravenously by rapid injection,  with further multi-slice axial sections   acquired in the arterial phase from the aortic arch to the cranial vertex.   Image post-processing was then performed using 3D techniques (for example,    MIP or 3D surface-rendered) to create reformatted images for comprehensive   analysis and diagnosis of the intracranial circulation including the   Circle of Willis. Any stenosis reported is calculated using the estimated   diameter of the distal normal vessel (e.g., ICA) in the denominator.    All CT scans at [facility] and [facility]   Imaging are performed using radiation dose optimization techniques as   appropriate to a performed exam, including but not limited to one or more   of the following: automatic exposure control, adjustment of the mA and/or   kV according to patient size, use of iterative reconstruction technique.   In addition, our institution participates in a radiation dose monitoring   program to optimize patient radiation exposure.    LIMITATION: The quality or scope of this examination is moderately   degraded because of patient-related (motion, artifact, positioning) or   technical factors beyond our control. Although small to medium-sized   lesions could conceivably be obscured, overall image quality is thought to   meet minimum standards of accepted community practice and may provide   potentially useful diagnostic information. If unanswered clinical   questions still remain, please contact the radiology service for   recommendations about rescheduling this patient or for selecting an   alternative imaging strategy.      FINDINGS:    CT HEAD:  Calvarium/skull base: No evidence of acute fracture or destructive lesion.   Mastoids and middle ears demonstrate no substantial mucosal disease.    Paranasal sinuses: No air fluid levels.    Brain: No acute large vascular territory infarct. No mass effect. No   hydrocephalus. No acute hemorrhage. Mild brain volume loss with ex vacuo   dilation of the ventricles. Partially empty sella.    CTA NECK:    Aortic arch: No significant stenosis (50% or greater) of the visualized   origins  of the major branch vessels.    Left carotid system: Evaluation of the common carotid is degraded due to   photon starvation artifact. No occlusion or significant stenosis (50% or   greater).    Right carotid system: Evaluation of the proximal common carotid is   degraded due to photon starvation artifact. No occlusion or significant   stenosis (50% or greater).    Vertebral arteries: Right dominant. No occlusion or significant stenosis   (50% or greater).    CTA HEAD:  Anterior circulation: No occlusion or significant stenosis (50% or   greater). No aneurysm.    Posterior circulation: Mild focal irregularity in the mid basilar artery   with morphology reminiscent administration but difficult to confirm due to   small size and quantum mottle. No occlusion or significant stenosis (50%   or greater). No aneurysm.    Venous structures: Opacified intracranial venous structures appear patent.    IMPRESSION:  1.  No acute intracranial abnormality. Please note that CT is relatively   insensitive for the detection of acute ischemia, and MRI could provide   further evaluation if there is clinical concern for acute infarction.  2.  Focal irregularity of the mid basilar artery correlating with a   similar finding described on outside imaging reports (no comparison images   available for direct comparison at the time of  dictation). Dissection is   favored less likely given location. Adherent thrombus is possible but   again favored less likely given location and apparent persistence over   time. This may simply represent a small incidental fenestration; however,   direct comparison to prior imaging would be helpful to ensure stability   over time.  3.  No evidence of acute arterial abnormality in the neck noting that   artifact degrades evaluation of the cervical vessels.       No data recorded  CHA2DS2-VASc Score: N/A  Glasgow Coma Scale Score: 15                  Final Clinical  Impressions(s) 1. Dizziness     ED Disposition:  Discharge  ED Prescriptions   None      This note was dictated using Dragon voice recognition software; please excuse any unintentional spelling or grammatical discrepancies.  Reena Grout [place], DO 11/12/2024       [1] Past Medical History: Diagnosis Date   Abnormal Pap smear of cervix    over 10 years ago caused by bacterial infection.   Bronchitis   [2] Past Surgical History: Procedure Laterality Date   HYSTERECTOMY      Procedure: HYSTERECTOMY; 1997  [3] Family History Problem Relation Name Age of Onset   Hypertension Mother     Lung cancer Father     Hypertension Father     Breast cancer Paternal Aunt     Hypertension Paternal Aunt    [4] Social History Tobacco Use   Smoking status: Never   Smokeless tobacco: Never  Substance Use Topics   Alcohol use: No   Drug use: No

## 2024-12-29 ENCOUNTER — Other Ambulatory Visit: Payer: Self-pay

## 2024-12-29 ENCOUNTER — Inpatient Hospital Stay (HOSPITAL_BASED_OUTPATIENT_CLINIC_OR_DEPARTMENT_OTHER)
Admission: EM | Admit: 2024-12-29 | Discharge: 2025-01-01 | DRG: 393 | Disposition: A | Discharge status: Home / Self Care | Payer: Self-pay | Attending: Internal Medicine | Admitting: Internal Medicine

## 2024-12-29 ENCOUNTER — Encounter (HOSPITAL_BASED_OUTPATIENT_CLINIC_OR_DEPARTMENT_OTHER): Payer: Self-pay

## 2024-12-29 DIAGNOSIS — Z5982 Transportation insecurity: Secondary | ICD-10-CM

## 2024-12-29 DIAGNOSIS — Z7982 Long term (current) use of aspirin: Secondary | ICD-10-CM

## 2024-12-29 DIAGNOSIS — Z7984 Long term (current) use of oral hypoglycemic drugs: Secondary | ICD-10-CM

## 2024-12-29 DIAGNOSIS — E785 Hyperlipidemia, unspecified: Secondary | ICD-10-CM | POA: Diagnosis present

## 2024-12-29 DIAGNOSIS — Z90710 Acquired absence of both cervix and uterus: Secondary | ICD-10-CM

## 2024-12-29 DIAGNOSIS — K625 Hemorrhage of anus and rectum: Secondary | ICD-10-CM | POA: Diagnosis not present

## 2024-12-29 DIAGNOSIS — Z880 Allergy status to penicillin: Secondary | ICD-10-CM

## 2024-12-29 DIAGNOSIS — K5731 Diverticulosis of large intestine without perforation or abscess with bleeding: Secondary | ICD-10-CM | POA: Diagnosis present

## 2024-12-29 DIAGNOSIS — E1165 Type 2 diabetes mellitus with hyperglycemia: Secondary | ICD-10-CM | POA: Diagnosis not present

## 2024-12-29 DIAGNOSIS — I10 Essential (primary) hypertension: Secondary | ICD-10-CM

## 2024-12-29 DIAGNOSIS — I779 Disorder of arteries and arterioles, unspecified: Secondary | ICD-10-CM | POA: Diagnosis present

## 2024-12-29 DIAGNOSIS — D62 Acute posthemorrhagic anemia: Secondary | ICD-10-CM

## 2024-12-29 DIAGNOSIS — Z79899 Other long term (current) drug therapy: Secondary | ICD-10-CM

## 2024-12-29 DIAGNOSIS — K5900 Constipation, unspecified: Secondary | ICD-10-CM | POA: Diagnosis present

## 2024-12-29 DIAGNOSIS — Z7902 Long term (current) use of antithrombotics/antiplatelets: Secondary | ICD-10-CM

## 2024-12-29 DIAGNOSIS — Z6841 Body Mass Index (BMI) 40.0 and over, adult: Secondary | ICD-10-CM

## 2024-12-29 DIAGNOSIS — K648 Other hemorrhoids: Principal | ICD-10-CM | POA: Diagnosis present

## 2024-12-29 DIAGNOSIS — E66813 Obesity, class 3: Secondary | ICD-10-CM | POA: Diagnosis present

## 2024-12-29 DIAGNOSIS — K921 Melena: Principal | ICD-10-CM | POA: Diagnosis present

## 2024-12-29 HISTORY — DX: Dissection of other precerebral arteries: I77.75

## 2024-12-29 LAB — COMPREHENSIVE METABOLIC PANEL WITH GFR
ALT: 10 U/L (ref 0–44)
AST: 13 U/L — ABNORMAL LOW (ref 15–41)
Albumin: 3.7 g/dL (ref 3.5–5.0)
Alkaline Phosphatase: 87 U/L (ref 38–126)
Anion gap: 11 (ref 5–15)
BUN: 22 mg/dL — ABNORMAL HIGH (ref 6–20)
CO2: 26 mmol/L (ref 22–32)
Calcium: 8.4 mg/dL — ABNORMAL LOW (ref 8.9–10.3)
Chloride: 102 mmol/L (ref 98–111)
Creatinine, Ser: 0.92 mg/dL (ref 0.44–1.00)
GFR, Estimated: >60 mL/min
Glucose, Bld: 319 mg/dL — ABNORMAL HIGH (ref 70–99)
Potassium: 3.7 mmol/L (ref 3.5–5.1)
Sodium: 139 mmol/L (ref 135–145)
Total Bilirubin: 0.4 mg/dL (ref 0.0–1.2)
Total Protein: 6.5 g/dL (ref 6.5–8.1)

## 2024-12-29 LAB — URINALYSIS, ROUTINE W REFLEX MICROSCOPIC
Bilirubin Urine: NEGATIVE
Glucose, UA: 250 mg/dL — AB
Hgb urine dipstick: NEGATIVE
Ketones, ur: NEGATIVE mg/dL
Leukocytes,Ua: NEGATIVE
Nitrite: NEGATIVE
Protein, ur: NEGATIVE mg/dL
Specific Gravity, Urine: >=1.03 (ref 1.005–1.030)
pH: 5.5 (ref 5.0–8.0)

## 2024-12-29 LAB — PROTIME-INR
INR: 1.1 (ref 0.8–1.2)
Prothrombin Time: 14.7 s (ref 11.4–15.2)

## 2024-12-29 LAB — CBC
HCT: 31.4 % — ABNORMAL LOW (ref 36.0–46.0)
Hemoglobin: 10.2 g/dL — ABNORMAL LOW (ref 12.0–15.0)
MCH: 27.7 pg (ref 26.0–34.0)
MCHC: 32.5 g/dL (ref 30.0–36.0)
MCV: 85.3 fL (ref 80.0–100.0)
Platelets: 273 K/uL (ref 150–400)
RBC: 3.68 MIL/uL — ABNORMAL LOW (ref 3.87–5.11)
RDW: 12.5 % (ref 11.5–15.5)
WBC: 10 K/uL (ref 4.0–10.5)
nRBC: 0 % (ref 0.0–0.2)

## 2024-12-29 LAB — TROPONIN T, HIGH SENSITIVITY
Troponin T High Sensitivity: <15 ng/L (ref 0–19)
Troponin T High Sensitivity: <15 ng/L (ref 0–19)

## 2024-12-29 LAB — HEMOGLOBIN A1C
Hgb A1c MFr Bld: 11 % — ABNORMAL HIGH (ref 4.8–5.6)
Mean Plasma Glucose: 269 mg/dL

## 2024-12-29 LAB — APTT: aPTT: 28 s (ref 24–36)

## 2024-12-29 LAB — HEMOGLOBIN AND HEMATOCRIT, BLOOD
HCT: 31.1 % — ABNORMAL LOW (ref 36.0–46.0)
Hemoglobin: 10 g/dL — ABNORMAL LOW (ref 12.0–15.0)

## 2024-12-29 LAB — CBG MONITORING, ED
Glucose-Capillary: 139 mg/dL — ABNORMAL HIGH (ref 70–99)
Glucose-Capillary: 109 mg/dL — ABNORMAL HIGH (ref 70–99)
Glucose-Capillary: 239 mg/dL — ABNORMAL HIGH (ref 70–99)

## 2024-12-29 LAB — LIPASE, BLOOD: Lipase: 35 U/L (ref 11–51)

## 2024-12-29 MED ORDER — INSULIN REGULAR HUMAN 100 UNIT/ML IJ SOLN: 6.0000 [IU] | Freq: Once | INTRAMUSCULAR | Status: DC

## 2024-12-29 MED ORDER — SODIUM CHLORIDE 0.9 % IV BOLUS
1000.0000 mL | Freq: Once | INTRAVENOUS | Status: AC
  Administered 2024-12-29: 1000 mL via INTRAVENOUS

## 2024-12-29 MED ORDER — HYDRALAZINE HCL 20 MG/ML IJ SOLN: 10.0000 mg | Freq: Four times a day (QID) | INTRAMUSCULAR | Status: DC | PRN

## 2024-12-29 MED ORDER — INSULIN ASPART 100 UNIT/ML IJ SOLN
6.0000 [IU] | Freq: Once | INTRAMUSCULAR | Status: AC
  Administered 2024-12-29: 6 [IU] via SUBCUTANEOUS

## 2024-12-29 MED ORDER — PANTOPRAZOLE SODIUM 40 MG IV SOLR
40.0000 mg | Freq: Two times a day (BID) | INTRAVENOUS | Status: DC
  Administered 2024-12-29 – 2025-01-01 (×5): 40 mg via INTRAVENOUS
  Filled 2024-12-29 (×6): qty 10

## 2024-12-29 MED ORDER — INSULIN ASPART 100 UNIT/ML IJ SOLN
0.0000 [IU] | INTRAMUSCULAR | Status: DC
  Administered 2024-12-29: 1 [IU] via SUBCUTANEOUS
  Administered 2024-12-30: 2 [IU] via SUBCUTANEOUS
  Administered 2024-12-30: 3 [IU] via SUBCUTANEOUS
  Administered 2024-12-30: 2 [IU] via SUBCUTANEOUS
  Administered 2024-12-30: 3 [IU] via SUBCUTANEOUS
  Administered 2024-12-30: 1 [IU] via SUBCUTANEOUS
  Administered 2024-12-31: 5 [IU] via SUBCUTANEOUS
  Administered 2024-12-31 (×3): 1 [IU] via SUBCUTANEOUS
  Administered 2025-01-01: 2 [IU] via SUBCUTANEOUS
  Administered 2025-01-01: 3 [IU] via SUBCUTANEOUS
  Filled 2024-12-29 (×3): qty 1
  Filled 2024-12-29: qty 3
  Filled 2024-12-29 (×3): qty 1
  Filled 2024-12-29: qty 2
  Filled 2024-12-29: qty 3
  Filled 2024-12-29 (×3): qty 1

## 2024-12-29 MED ORDER — LACTATED RINGERS IV SOLN: INTRAVENOUS | Status: AC

## 2024-12-29 MED ORDER — PANTOPRAZOLE SODIUM 40 MG IV SOLR
80.0000 mg | Freq: Once | INTRAVENOUS | Status: AC
  Administered 2024-12-29: 80 mg via INTRAVENOUS
  Filled 2024-12-29: qty 20

## 2024-12-29 MED ORDER — ALPRAZOLAM 0.5 MG PO TABS: 1.0000 mg | ORAL_TABLET | Freq: Every evening | ORAL | Status: DC | PRN

## 2024-12-29 MED ORDER — INSULIN ASPART 100 UNIT/ML IJ SOLN
6.0000 [IU] | Freq: Once | INTRAMUSCULAR | Status: DC
  Filled 2024-12-29: qty 6

## 2024-12-29 NOTE — Assessment & Plan Note (Signed)
 In May 2025, pt had basilar artery dissection. Was admitted 5/9--10/2024. Discharged on ASA/Plavix  x 3 months followed by ASA 81 mg daily. No acute issues. Pt reports full neurologic recovery. --Monitor

## 2024-12-29 NOTE — Assessment & Plan Note (Signed)
 On amlodipine  5 mg. Hold scheduled antihypertensives with rectal bleed and risk of hypotension.  PRN IV hydralazine  for now.

## 2024-12-29 NOTE — Assessment & Plan Note (Addendum)
 Initial Hbg is 10.2 , down from normal 13.2-14.0 back in August. With rectal bleeding.  Pt had BM in the ED with both small clots and bright red blood.  Repeat Hbg stable at 10.0 at noon. --Trend Hbg Q6H --Type and Screen --Transfuse for Hbg < 7 or if <8 with active bleeding ongoing --See Rectal Bleeding

## 2024-12-29 NOTE — H&P (Addendum)
 " History and Physical    Patient: Diane Reese DOB: [birth year] DOA: 12/29/2024 DOS: the patient was seen and examined on 12/29/2024 PCP: Neysa Tinnie BRAVO, PA   Referring Provider: Dr. Rosette Dais, MD Telemedicine Provider: Dr. Burnard Cunning, DO Patient Location: [facility] ED Referring Diagnosis: Rectal bleeding Patient Name and DOB verified: Diane Reese, 11-27-1971 Patient consented to Telemedicine Evaluation: Yes RN virtual assistant: Stacy Seip, RN Video encounter time and date: 12/29/2024 12:50   Patient coming from: Home  Chief Complaint:  Chief Complaint  Patient presents with   Rectal Bleeding   HPI: Diane Reese is a 54 y.o. 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  Patient reports onset of bleeding 8:30 PM and has had 6 bloody bowel movements since that time.  She reports having to strain for bowel movements recently.  Has had some mild intermittent epigastric pain.  Other than aspirin  does not take any NSAIDs.  Denies any nausea or vomiting.  She reports a chronic cough from sinus issues that is been slightly increased lately but no fevers chills sore throat or suspicion for upper respiratory infection at this time.  She denies chest pain, dizziness or lightheadedness, weakness or shortness of breath.  She reports being fully recovered from her basilar artery dissection that occurred back in May.  She has never had any GI bleeding episodes in the past including when she was on both aspirin  and Plavix  for 3 months.  No other recent illnesses or acute complaints..  Patient reports having a normal 5 years ago, was told to return for repeat in 10 years.  She was seen by GI at [facility].  ED course- Initial vitals temp 98.5 F, HR 101 (92-1 02) RR 22, BP 141/69, SpO2 97-100% on room air. Labs obtained including CMP and CBC were notable for nonfasting glucose  319, BUN 22 with normal creatinine 0.92, calcium  8.4, hemoglobin 10.2 down from 13 in May.  Troponin was normal x 2.  Urinalysis was without any signs of infection, did show glucosuria..  Patient was treated in the ED with 6 units NovoLog , 80 mg IV Protonix , 1 L bolus normal saline.  Repeat hemoglobin was drawn and pending at time of admission encounter.  Patient is being admitted to progressive unit at either [facility] or [facility] (first available).  Both GI groups have been made aware for consultation and will notify the appropriate group pending available bed.  Details of the evaluation and management as outlined below.   Review of Systems: As mentioned in the history of present illness. All other systems reviewed and are negative.   Past Medical History:  Diagnosis Date   Dissection of basilar artery    Past Surgical History:  Procedure Laterality Date   ABDOMINAL HYSTERECTOMY     Social History:  reports that she has never smoked. She has never used smokeless tobacco. She reports that she does not drink alcohol and does not use drugs.  Allergies[1]  Family History  Problem Relation Age of Onset   Stroke Sister     Prior to Admission medications  Medication Sig Start Date End Date Taking? Authorizing Provider  Vitamin D, Ergocalciferol, (DRISDOL) 1.25 MG (50000 UNIT) CAPS capsule Take 50,000 Units by mouth once a week. 10/31/24  Yes [provider]  ALPRAZolam  (XANAX ) 1 MG tablet Take 1 tablet (1 mg total) by mouth at bedtime as needed  for anxiety (to take prior to MRI. Can take a second dose if needed.). 07/21/24   Camara, Amadou, MD  amLODipine  (NORVASC ) 5 MG tablet Take 1 tablet (5 mg total) by mouth daily. 04/27/24 04/27/25  Raenelle Coria, MD  aspirin  EC 81 MG tablet Take 1 tablet (81 mg total) by mouth daily. Swallow whole. 04/28/24   Raenelle Coria, MD  clopidogrel  (PLAVIX ) 75 MG tablet Take 1 tablet (75 mg total) by mouth daily. 04/28/24   Raenelle Coria, MD   metFORMIN  (GLUCOPHAGE ) 500 MG tablet Take 1 tablet (500 mg total) by mouth 2 (two) times daily with a meal. 04/28/24   Raenelle Coria, MD  rosuvastatin  (CRESTOR ) 20 MG tablet Take 1 tablet (20 mg total) by mouth daily. 04/27/24 07/26/24  Raenelle Coria, MD    Physical Exam: Vitals:   12/29/24 1045 12/29/24 1215 12/29/24 1354 12/29/24 1430  BP:    125/61  Pulse: 93 93  92  Resp:  18  20  Temp:   98.7 F (37.1 C) 98.7 F (37.1 C)  TempSrc:   Oral   SpO2: 99% 100%  99%  Weight:       Bedside physical exam was performed by RN listed above. Below exam findings are based on their in person physical exam findings and my observations during virtual encounter.  General exam: awake, alert, no acute distress HEENT: voice clear, hearing grossly normal  Respiratory system: CTAB, no wheezes, rales or rhonchi, normal respiratory effort. Cardiovascular system: normal S1/S2, RRR, no pedal edema.   Gastrointestinal system: soft, NT, ND, +bowel sounds. Central nervous system: A&O x4. no gross focal neurologic deficits, normal speech Extremities: moves all , no edema, normal tone Skin: dry, intact, normal temperature per RN Psychiatry: normal mood, congruent affect, judgement and insight appear normal'   Data Reviewed:  As reviewed in detail above  Assessment and Plan:  * Rectal bleeding Pt reporting 6 bowel movements with bright red blood, and this AM some clots since yesterday evening.  Intermittent abdominal discomfort since Saturday.  Was on DAPT with ASA/Plavix  since August for basilar artery dissection but currently only on ASA 81 mg.  No other NSAID use.  Pt does reports recently needing to strain signficantly for BM's.  ?Internal hemorrhoid vs diverticular source. Seems less likely upper source give hemodynamic stability and bright red blood, however BUN is up slightly and pt has reported intermittent epigastric pain recently.  BP stable, mildly tachycardic. --Admit to Progressive for close  monitoring --Telemetry --GI consulted - both [place] (for [facility]) and [place] (for [employer]) are aware. Will notify appropriate once pt has a bed. Pending first available PCU bed. --NPO ex ice chips --IV PPI for now --Maintenance IV fluids --Monitor closely --Trend Hbg as below --SCD's for DVT ppx  Acute blood loss anemia (ABLA) Initial Hbg is 10.2 , down from normal 13.2-14.0 back in August. With rectal bleeding.  Pt had BM in the ED with both small clots and bright red blood.  Repeat Hbg stable at 10.0 at noon. --Trend Hbg Q6H --Type and Screen --Transfuse for Hbg < 7 or if <8 with active bleeding ongoing --See Rectal Bleeding  Type 2 diabetes mellitus with hyperglycemia (HCC) Initial glucose 319 on ED labs.  Not in DKA/HHS.  Given 6 units regular insulin . --Sliding scale Novolog  Q4H while NPO --Last A1c was 7.6% in May --Repeat A1c --Titrate regimen --Hold metformin  (confirm regimen pending med hx)  Hyperlipidemia On Crestor  20 daily - resume when taking PO  Essential hypertension  On amlodipine  5 mg. Hold scheduled antihypertensives with rectal bleed and risk of hypotension.  PRN IV hydralazine  for now.  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 Was admitted 5/9--10/2024. Discharged on ASA/Plavix  x 3 months followed by ASA 81 mg daily. No acute issues. Pt reports full neurologic recovery. --Monitor      Advance Care Planning: Code status - full code  Consults: GI   Family Communication: present in room during virtual admission encounter  Severity of Illness: The appropriate patient status for this patient is OBSERVATION. Observation status is judged to be reasonable and necessary in order to provide the required intensity of service to ensure the patient's safety. The patient's presenting symptoms, physical exam findings, and initial radiographic and laboratory data in the context of their medical condition is felt to place them at decreased risk  for further clinical deterioration. Furthermore, it is anticipated that the patient will be medically stable for discharge from the hospital within 2 midnights of admission.   Author: Burnard DELENA Cunning, DO 12/29/2024 3:03 PM  For on call review [email or web address].      [1]  Allergies Allergen Reactions   Penicillins Hives   "

## 2024-12-29 NOTE — Assessment & Plan Note (Signed)
 Initial glucose 319 on ED labs.  Not in DKA/HHS.  Given 6 units regular insulin . --Sliding scale Novolog  Q4H while NPO --Last A1c was 7.6% in May --Repeat A1c --Titrate regimen --Hold metformin  (confirm regimen pending med hx)

## 2024-12-29 NOTE — ED Provider Notes (Signed)
 " [facility] Provider Note   CSN: [id number] Arrival date & time: 12/29/24  9170     Patient presents with: Rectal Bleeding   Diane Reese is a 54 y.o. female.  54 year old female with a history of basilar artery dissection approximately 6 months ago, started on dual antiplatelet therapy and stop Plavix  3 months ago who presents with rectal bleeding since last night.  Has had bright red blood per rectum approximately 5-6 times since 8:30 PM.  Her last bowel movement was at 7:30 AM and she noted just small clots and bright red blood in the toilet.  No stool.  She has a dull epigastric pain that comes and goes and has been ongoing for some time.  She denies any chest pain, shortness of breath, dizziness, weakness.  She had a normal colonoscopy about 5 years ago and was told she needed to return in 10 years.  Her only current medication is aspirin .    Prior to Admission medications  Medication Sig Start Date End Date Taking? Authorizing Provider  Vitamin D, Ergocalciferol, (DRISDOL) 1.25 MG (50000 UNIT) CAPS capsule Take 50,000 Units by mouth once a week. 10/31/24  Yes [provider]  ALPRAZolam  (XANAX ) 1 MG tablet Take 1 tablet (1 mg total) by mouth at bedtime as needed for anxiety (to take prior to MRI. Can take a second dose if needed.). 07/21/24   Camara, Amadou, MD  amLODipine  (NORVASC ) 5 MG tablet Take 1 tablet (5 mg total) by mouth daily. 04/27/24 04/27/25  Raenelle Coria, MD  aspirin  EC 81 MG tablet Take 1 tablet (81 mg total) by mouth daily. Swallow whole. 04/28/24   Raenelle Coria, MD  clopidogrel  (PLAVIX ) 75 MG tablet Take 1 tablet (75 mg total) by mouth daily. 04/28/24   Raenelle Coria, MD  metFORMIN  (GLUCOPHAGE ) 500 MG tablet Take 1 tablet (500 mg total) by mouth 2 (two) times daily with a meal. 04/28/24   Raenelle Coria, MD  rosuvastatin  (CRESTOR ) 20 MG tablet Take 1 tablet (20 mg total) by mouth daily. 04/27/24 07/26/24  Raenelle Coria, MD    Allergies: Penicillins    Review of Systems  Gastrointestinal:  Positive for hematochezia.    Updated Vital Signs BP 135/78   Pulse 93   Temp 98.5 F (36.9 C)   Resp 18   Wt 106.1 kg   SpO2 100%   BMI 44.21 kg/m   Physical Exam   Constitutional: Patient appears well-developed and well-nourished. No distress.  HENT:  Head: Normocephalic and atraumatic.  Right Ear: External ear normal.  Left Ear: External ear normal.  Mouth/Throat: Oropharynx is clear and moist. No oropharyngeal exudate.  Eyes: Conjunctivae are normal. Pupils are equal, round, and reactive to light. Neck: Normal range of motion. Neck supple.  Cardiovascular: Normal rate, regular rhythm, normal heart sounds and intact distal pulses.   Pulmonary/Chest: Effort normal and breath sounds normal. No respiratory distress. No wheezes or rales.  Abdominal: Soft. Bowel sounds are normal. No distension or tenderness.  Rectal: No stool in vault, trace bright red blood noted with no active bleeding, small noninflamed external hemorrhoids. Musculoskeletal: Normal range of motion. No edema or tenderness.  Neurological: Patient is alert with normal muscle tone. Coordination normal.  Skin: Skin is warm and dry. No diaphoresis.  Psychiatric: Normal mood and affect. Normal behavior. Judgment and thought content normal.  Nursing note and vitals reviewed.   (all labs ordered are listed, but only abnormal results are displayed) Labs  Reviewed  COMPREHENSIVE METABOLIC PANEL WITH GFR - Abnormal; Notable for the following components:      Result Value   Glucose, Bld 319 (*)    BUN 22 (*)    Calcium  8.4 (*)    AST 13 (*)    All other components within normal limits  CBC - Abnormal; Notable for the following components:   RBC 3.68 (*)    Hemoglobin 10.2 (*)    HCT 31.4 (*)    All other components within normal limits  HEMOGLOBIN AND HEMATOCRIT, BLOOD - Abnormal; Notable for the following components:   Hemoglobin  10.0 (*)    HCT 31.1 (*)    All other components within normal limits  LIPASE, BLOOD  PROTIME-INR  APTT  URINALYSIS, ROUTINE W REFLEX MICROSCOPIC  TROPONIN T, HIGH SENSITIVITY  TROPONIN T, HIGH SENSITIVITY    EKG: Sinus tachycardia at 101 bpm, normal axis, no acute ischemia.  Radiology: No results found.    Medications Ordered in the ED  pantoprazole  (PROTONIX ) injection 80 mg (80 mg Intravenous Given 12/29/24 0942)  sodium chloride  0.9 % bolus 1,000 mL (1,000 mLs Intravenous New Bag/Given 12/29/24 1159)  insulin  aspart (novoLOG ) injection 6 Units (6 Units Subcutaneous Given 12/29/24 1213)                                Medical Decision Making Amount and/or Complexity of Data Reviewed Labs: ordered.  Risk OTC drugs. Prescription drug management.  This patient presents to the ED with chief complaint(s) of bright red blood per rectum with pertinent past medical history of basilar artery dissection now on aspirin . The complaint involves an extensive differential diagnosis and also carries with it a high risk of complications and morbidity.    Additional history obtained from none. I have also reviewed previous admission documents  The differential diagnosis includes GI bleed, diverticulosis, polyp, upper GI bleed seems less likely but possible.  The initial management included labs, 2 Ivs, Orthostatics, Protonix , no type and screen available.  Have alerted GI and hospitalist.   Consideration for admission or further workup: needs admission  Social Determinants of health: none  54 year old female presents with bright red blood per rectum overnight.  Hemoglobin has dropped 3 points.  She was mildly tachycardic and mildly orthostatic by heart rate.  Second hemoglobin is pending.  Is alert with heart rate in the 90s and blood pressure in the 130s at present.  Seems stable.  We have attempted to contact GI without success but I did discuss her case with the hospitalist who will  start the process of her admission.  Patient is amenable to this.  For now n.p.o., IV fluids, monitor glucose and hemoglobin.  Final diagnoses:  Rectal bleeding    ED Discharge Orders     None          Fredia Rosette Kirsch, MD 12/29/24 1222  "

## 2024-12-29 NOTE — ED Triage Notes (Signed)
 Pt reports upper abdominal discomfort on Saturday. Rectal bleeding began Sunday. Reports 6 BMS with blood and clots. Denies N/V. On baby asa daily

## 2024-12-29 NOTE — Assessment & Plan Note (Addendum)
 Pt reporting 6 bowel movements with bright red blood, and this AM some clots since yesterday evening.  Intermittent abdominal discomfort since Saturday.  Was on DAPT with ASA/Plavix  since August for basilar artery dissection but currently only on ASA 81 mg.  No other NSAID use.  Pt does reports recently needing to strain signficantly for BM's.  ?Internal hemorrhoid vs diverticular source. Seems less likely upper source give hemodynamic stability and bright red blood, however BUN is up slightly and pt has reported intermittent epigastric pain recently.  BP stable, mildly tachycardic. --Admit to Progressive for close monitoring --Telemetry --GI consulted - both [place] (for [facility]) and [place] (for [facility]) are aware. Will notify appropriate once pt has a bed. Pending first available PCU bed. --NPO ex ice chips --IV PPI for now --Maintenance IV fluids --Monitor closely --Trend Hbg as below --SCD's for DVT ppx

## 2024-12-29 NOTE — Assessment & Plan Note (Signed)
 On Crestor  20 daily - resume when taking PO

## 2024-12-30 DIAGNOSIS — I10 Essential (primary) hypertension: Secondary | ICD-10-CM | POA: Diagnosis not present

## 2024-12-30 DIAGNOSIS — K625 Hemorrhage of anus and rectum: Secondary | ICD-10-CM

## 2024-12-30 DIAGNOSIS — Z743 Need for continuous supervision: Secondary | ICD-10-CM | POA: Diagnosis not present

## 2024-12-30 LAB — BASIC METABOLIC PANEL WITH GFR
Anion gap: 8 (ref 5–15)
BUN: 17 mg/dL (ref 6–20)
CO2: 28 mmol/L (ref 22–32)
Calcium: 8.1 mg/dL — ABNORMAL LOW (ref 8.9–10.3)
Chloride: 105 mmol/L (ref 98–111)
Creatinine, Ser: 0.75 mg/dL (ref 0.44–1.00)
GFR, Estimated: >60 mL/min
Glucose, Bld: 182 mg/dL — ABNORMAL HIGH (ref 70–99)
Potassium: 3.5 mmol/L (ref 3.5–5.1)
Sodium: 141 mmol/L (ref 135–145)

## 2024-12-30 LAB — CBG MONITORING, ED
Glucose-Capillary: 116 mg/dL — ABNORMAL HIGH (ref 70–99)
Glucose-Capillary: 148 mg/dL — ABNORMAL HIGH (ref 70–99)
Glucose-Capillary: 157 mg/dL — ABNORMAL HIGH (ref 70–99)
Glucose-Capillary: 203 mg/dL — ABNORMAL HIGH (ref 70–99)
Glucose-Capillary: 246 mg/dL — ABNORMAL HIGH (ref 70–99)

## 2024-12-30 LAB — CBC WITH DIFFERENTIAL/PLATELET
Abs Immature Granulocytes: 0.04 K/uL (ref 0.00–0.07)
Basophils Absolute: 0 K/uL (ref 0.0–0.1)
Basophils Relative: 0 %
Eosinophils Absolute: 0.3 K/uL (ref 0.0–0.5)
Eosinophils Relative: 3 %
HCT: 26.9 % — ABNORMAL LOW (ref 36.0–46.0)
Hemoglobin: 8.6 g/dL — ABNORMAL LOW (ref 12.0–15.0)
Immature Granulocytes: 0 %
Lymphocytes Relative: 30 %
Lymphs Abs: 2.8 K/uL (ref 0.7–4.0)
MCH: 27.9 pg (ref 26.0–34.0)
MCHC: 32 g/dL (ref 30.0–36.0)
MCV: 87.3 fL (ref 80.0–100.0)
Monocytes Absolute: 0.5 K/uL (ref 0.1–1.0)
Monocytes Relative: 5 %
Neutro Abs: 5.8 K/uL (ref 1.7–7.7)
Neutrophils Relative %: 62 %
Platelets: 221 K/uL (ref 150–400)
RBC: 3.08 MIL/uL — ABNORMAL LOW (ref 3.87–5.11)
RDW: 12.8 % (ref 11.5–15.5)
WBC: 9.4 K/uL (ref 4.0–10.5)
nRBC: 0 % (ref 0.0–0.2)

## 2024-12-30 LAB — GLUCOSE, CAPILLARY: Glucose-Capillary: 185 mg/dL — ABNORMAL HIGH (ref 70–99)

## 2024-12-30 LAB — HEMOGLOBIN AND HEMATOCRIT, BLOOD
HCT: 27.1 % — ABNORMAL LOW (ref 36.0–46.0)
Hemoglobin: 8.7 g/dL — ABNORMAL LOW (ref 12.0–15.0)

## 2024-12-30 MED ORDER — BISACODYL 5 MG PO TBEC
10.0000 mg | DELAYED_RELEASE_TABLET | Freq: Once | ORAL | Status: DC
  Filled 2024-12-30: qty 2

## 2024-12-30 MED ORDER — SODIUM CHLORIDE 0.9 % IV SOLN: INTRAVENOUS | Status: DC

## 2024-12-30 MED ORDER — PEG 3350-KCL-NA BICARB-NACL 420 G PO SOLR
2000.0000 mL | Freq: Once | ORAL | Status: AC
  Administered 2024-12-31: 2000 mL via ORAL
  Filled 2024-12-30: qty 4000

## 2024-12-30 MED ORDER — PEG 3350-KCL-NA BICARB-NACL 420 G PO SOLR
2000.0000 mL | Freq: Once | ORAL | Status: AC
  Administered 2024-12-30: 2000 mL via ORAL
  Filled 2024-12-30: qty 4000

## 2024-12-30 MED ORDER — LACTATED RINGERS IV SOLN: INTRAVENOUS | Status: DC

## 2024-12-30 NOTE — Progress Notes (Signed)
" ° °  Brief Progress Note   _____________________________________________________________________________________________________________  Patient Name: [name] Patient DOB: 26-Mar-1971 Date: @TODAY @      Data: Reviewed labs, Notes, VS.      Action: No action needed at this time.      Response:    _____________________________________________________________________________________________________________  [facility] RN Expeditor Sharolyn JONETTA Batman Please contact us  directly via secure chat (search for [facility]) or by calling us  at [phone] [facility]).  "

## 2024-12-30 NOTE — Progress Notes (Signed)
 " PROGRESS NOTE    Diane Reese  FMW:[id number] DOB: 02/25/1971 DOA: 12/29/2024 PCP: Neysa Tinnie BRAVO, PA   Brief Narrative:  54 year old female with history of hypertension, diabetes mellitus type 2, hyperlipidemia, basilar artery dissection admitted on [place] presented with rectal bleeding.  On presentation, hemoglobin was 10.2, down from 13 from May 2025.  GI was consulted.  Assessment & Plan:   Possible lower GI bleeding presenting with rectal bleeding Acute blood loss anemia - Presented with 6 bowel movements with bright red blood with intermittent abdominal discomfort.  Currently on aspirin  81 mg daily at home - On presentation, hemoglobin was 10.2, down from 13 from May 2025. - GI has been consulted: Planning for colonoscopy tomorrow - Hemoglobin 8.6 this morning.  Monitor H&H.  Transfuse if needed.  Continue IV Protonix  for now  Diabetes mellitus type 2 with hyperglycemia - A1c 11.  Continue CBGs with SSI.  Hyperlipidemia - Resume Crestor  once taking orally  Essential hypertension - Amlodipine  on hold.  Monitor blood pressure.  Continue IV hydralazine  if needed  History of basilar artery dissection - In May 2025.  Treated with 3 months of aspirin  and Plavix  and currently on aspirin  81 mg daily.  No active issues.  Outpatient follow-up.  Obesity class III -Outpatient follow up   DVT prophylaxis: SCDs Code Status: Full Family Communication: Husband at bedside Disposition Plan: Should be inpatient because patient will need colonoscopy tomorrow.   Consultants: GI  Procedures: None  Antimicrobials: None   Subjective: Patient seen and examined at bedside.  No fever, vomiting, worsening abdominal pain reported.  No bowel movement since admission.  Objective: Vitals:   12/30/24 0900 12/30/24 0949 12/30/24 1040 12/30/24 1457  BP:   137/72 (!) 131/57  Pulse: 92  92 89  Resp: 13  16 16   Temp:  98.9 F (37.2 C) 98.3 F (36.8 C)   TempSrc:  Oral Oral   SpO2: 97%   100% 99%  Weight:        Intake/Output Summary (Last 24 hours) at 12/30/2024 1557 Last data filed at 12/30/2024 0433 Gross per 24 hour  Intake 1000.3 ml  Output --  Net 1000.3 ml   Filed Weights   12/29/24 0842  Weight: 106.1 kg    Examination:  General exam: Appears calm and comfortable  Respiratory system: Bilateral decreased breath sounds at bases Cardiovascular system: S1 & S2 heard, Rate controlled Gastrointestinal system: Abdomen is morbidly obese, nondistended, soft and nontender. Normal bowel sounds heard. Extremities: No cyanosis, clubbing, edema  Central nervous system: Alert and oriented. No focal neurological deficits. Moving extremities Skin: No rashes, lesions or ulcers Psychiatry: Judgement and insight appear normal. Mood & affect appropriate.     Data Reviewed: I have personally reviewed following labs and imaging studies  CBC: Recent Labs  Lab 12/29/24 0920 12/29/24 1159 12/30/24 0032 12/30/24 0542  WBC 10.0  --   --  9.4  NEUTROABS  --   --   --  5.8  HGB 10.2* 10.0* 8.7* 8.6*  HCT 31.4* 31.1* 27.1* 26.9*  MCV 85.3  --   --  87.3  PLT 273  --   --  221   Basic Metabolic Panel: Recent Labs  Lab 12/29/24 0920 12/30/24 0542  NA 139 141  K 3.7 3.5  CL 102 105  CO2 26 28  GLUCOSE 319* 182*  BUN 22* 17  CREATININE 0.92 0.75  CALCIUM  8.4* 8.1*   GFR: CrCl cannot be calculated (Unknown ideal weight.). Liver  Function Tests: Recent Labs  Lab 12/29/24 0920  AST 13*  ALT 10  ALKPHOS 87  BILITOT 0.4  PROT 6.5  ALBUMIN 3.7   Recent Labs  Lab 12/29/24 0920  LIPASE 35   No results for input(s): AMMONIA in the last 168 hours. Coagulation Profile: Recent Labs  Lab 12/29/24 0920  INR 1.1   Cardiac Enzymes: No results for input(s): CKTOTAL, CKMB, CKMBINDEX, TROPONINI in the last 168 hours. BNP (last 3 results) No results for input(s): PROBNP in the last 8760 hours. HbA1C: Recent Labs    12/29/24 0920  HGBA1C 11.0*    CBG: Recent Labs  Lab 12/29/24 2220 12/30/24 0006 12/30/24 0358 12/30/24 0809 12/30/24 1155  GLUCAP 239* 246* 203* 157* 148*   Lipid Profile: No results for input(s): CHOL, HDL, LDLCALC, TRIG, CHOLHDL, LDLDIRECT in the last 72 hours. Thyroid Function Tests: No results for input(s): TSH, T4TOTAL, FREET4, T3FREE, THYROIDAB in the last 72 hours. Anemia Panel: No results for input(s): VITAMINB12, FOLATE, FERRITIN, TIBC, IRON, RETICCTPCT in the last 72 hours. Sepsis Labs: No results for input(s): PROCALCITON, LATICACIDVEN in the last 168 hours.  No results found for this or any previous visit (from the past 240 hours).       Radiology Studies: No results found.      Scheduled Meds:  bisacodyl   10 mg Oral Once   insulin  aspart  0-9 Units Subcutaneous Q4H   pantoprazole  (PROTONIX ) IV  40 mg Intravenous Q12H   polyethylene glycol-electrolytes  2,000 mL Oral Once   Followed by   [START ON 12/31/2024] polyethylene glycol-electrolytes  2,000 mL Oral Once   Continuous Infusions:  sodium chloride             Sophie Mao, MD [facility] 12/30/2024, 3:57 PM   "

## 2024-12-30 NOTE — Consult Note (Signed)
 [facility] Consult  Referring Provider: ER Primary Care Physician:  Neysa Tinnie BRAVO, PA Primary Gastroenterologist: [facility]  Reason for Consultation: Rectal bleeding  HPI: Diane Reese is a 54 y.o. female was in her usual state of health until Sunday evening when she had a bloody bowel movement.  She describes seeing bright red blood and had mild upper abdominal discomfort which she describes as bloating.  Throughout the night she had a total of 6 of those episodes, eventually started noting clots and then came to the ER at [facility].  She has not had a bowel movement since then. Denies abdominal pain. 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  She stays constipated and may have a bowel movement every 2 to 3 days but denies noticing blood in her stool before. Denies nausea, vomiting, acid reflux, heartburn, difficulty swallowing, pain on swallowing, unintentional weight loss or loss of appetite.  Previous GI workup: Colonoscopy, Dr. Dianna, screening, 02/2021: Diverticulosis in sigmoid, internal hemorrhoids, repeat recommended in 10 years Past Medical History:  Diagnosis Date   Dissection of basilar artery     Past Surgical History:  Procedure Laterality Date   ABDOMINAL HYSTERECTOMY      Prior to Admission medications  Medication Sig Start Date End Date Taking? Authorizing Provider  Vitamin D, Ergocalciferol, (DRISDOL) 1.25 MG (50000 UNIT) CAPS capsule Take 50,000 Units by mouth once a week. 10/31/24  Yes [provider]  ALPRAZolam  (XANAX ) 1 MG tablet Take 1 tablet (1 mg total) by mouth at bedtime as needed for anxiety (to take prior to MRI. Can take a second dose if needed.). Patient not taking: Reported on 12/29/2024 07/21/24   Camara, Amadou, MD  amLODipine  (NORVASC ) 5 MG tablet Take 1 tablet (5 mg total) by mouth daily. Patient  not taking: Reported on 12/29/2024 04/27/24 04/27/25  Raenelle Coria, MD  aspirin  EC 81 MG tablet Take 1 tablet (81 mg total) by mouth daily. Swallow whole. 04/28/24   Raenelle Coria, MD  clopidogrel  (PLAVIX ) 75 MG tablet Take 1 tablet (75 mg total) by mouth daily. Patient not taking: Reported on 12/29/2024 04/28/24   Raenelle Coria, MD  metFORMIN  (GLUCOPHAGE ) 500 MG tablet Take 1 tablet (500 mg total) by mouth 2 (two) times daily with a meal. Patient not taking: Reported on 12/29/2024 04/28/24   Raenelle Coria, MD    Current Facility-Administered Medications  Medication Dose Route Frequency Provider Last Rate Last Admin   hydrALAZINE  (APRESOLINE ) injection 10 mg  10 mg Intravenous Q6H PRN Fausto Sor A, DO       insulin  aspart (novoLOG ) injection 0-9 Units  0-9 Units Subcutaneous Q4H Fausto Sor A, DO   1 Units at 12/30/24 1233   lactated ringers  infusion   Intravenous Continuous Fausto Sor A, DO 75 mL/hr at 12/30/24 0436 New Bag at 12/30/24 0436   pantoprazole  (PROTONIX ) injection 40 mg  40 mg Intravenous Q12H Fausto Sor A, DO   40 mg at 12/29/24 2215   Current Outpatient Medications  Medication Sig Dispense Refill   Vitamin D, Ergocalciferol, (DRISDOL) 1.25 MG (50000 UNIT) CAPS capsule Take 50,000 Units by mouth once a week.     ALPRAZolam  (XANAX ) 1 MG tablet Take 1 tablet (1 mg total) by mouth at bedtime as needed for anxiety (to take prior to MRI. Can take a second dose if needed.). (Patient not taking:  Reported on 12/29/2024) 2 tablet 0   amLODipine  (NORVASC ) 5 MG tablet Take 1 tablet (5 mg total) by mouth daily. (Patient not taking: Reported on 12/29/2024) 30 tablet 11   aspirin  EC 81 MG tablet Take 1 tablet (81 mg total) by mouth daily. Swallow whole. 30 tablet 12   clopidogrel  (PLAVIX ) 75 MG tablet Take 1 tablet (75 mg total) by mouth daily. (Patient not taking: Reported on 12/29/2024) 90 tablet 0   metFORMIN  (GLUCOPHAGE ) 500 MG tablet Take 1 tablet (500 mg total) by mouth 2  (two) times daily with a meal. (Patient not taking: Reported on 12/29/2024) 180 tablet 0    Allergies as of 12/29/2024 - Review Complete 12/29/2024  Allergen Reaction Noted   Penicillins Hives 08/07/2013    Family History  Problem Relation Age of Onset   Stroke Sister     Social History   Socioeconomic History   Marital status: Married    Spouse name: Not on file   Number of children: Not on file   Years of education: Not on file   Highest education level: Not on file  Occupational History   Not on file  Tobacco Use   Smoking status: Never   Smokeless tobacco: Never  Substance and Sexual Activity   Alcohol use: No   Drug use: No   Sexual activity: Yes    Birth control/protection: Surgical  Other Topics Concern   Not on file  Social History Narrative   Not on file   Social Drivers of Health   Tobacco Use: Low Risk (12/29/2024)   Patient History    Smoking Tobacco Use: Never    Smokeless Tobacco Use: Never    Passive Exposure: Not on file  Financial Resource Strain: Low Risk (05/14/2024)   Received from [facility]   Overall Financial Resource Strain (CARDIA)    Difficulty of Paying Living Expenses: Not hard at all  Food Insecurity: No Food Insecurity (05/14/2024)   Received from [facility]   Epic    Within the past 12 months, you worried that your food would run out before you got the money to buy more.: Never true    Within the past 12 months, the food you bought just didn't last and you didn't have money to get more.: Never true  Transportation Needs: No Transportation Needs (05/14/2024)   Received from [facility] - Transportation    Lack of Transportation (Non-Medical): No    Lack of Transportation (Medical): No  Recent Concern: Transportation Needs - Unmet Transportation Needs (04/26/2024)   PRAPARE - Administrator, Civil Service (Medical): Yes    Lack of Transportation (Non-Medical): Yes  Physical Activity: Not on file  Stress:  Not on file  Social Connections: Not on file  Intimate Partner Violence: Not At Risk (04/26/2024)   Humiliation, Afraid, Rape, and Kick questionnaire    Fear of Current or Ex-Partner: No    Emotionally Abused: No    Physically Abused: No    Sexually Abused: No  Depression (PHQ2-9): Not on file  Alcohol Screen: Not on file  Housing: Low Risk (05/14/2024)   Received from [facility]   Epic    At any time in the past 12 months, were you homeless or living in a shelter (including now)?: No    In the past 12 months, how many times have you moved where you were living?: 1    In the last 12 months, was there a time when  you were not able to pay the mortgage or rent on time?: No  Utilities: Not At Risk (05/14/2024)   Received from [facility] Utilities    Threatened with loss of utilities: No  Health Literacy: Not on file    Review of Systems: As per HPI Physical Exam: Vital signs in last 24 hours: Temp:  [98.3 F (36.8 C)-99 F (37.2 C)] 98.3 F (36.8 C) (01/13 1040) Pulse Rate:  [81-95] 92 (01/13 1040) Resp:  [13-23] 16 (01/13 1040) BP: (104-138)/(43-82) 137/72 (01/13 1040) SpO2:  [95 %-100 %] 100 % (01/13 1040) Last BM Date : 12/29/24  General:   Alert,  Well-developed, overweight, pleasant and cooperative in NAD Head:  Normocephalic and atraumatic. Eyes:  Sclera clear, no icterus.   Mild pallor Ears:  Normal auditory acuity. Nose:  No deformity, discharge,  or lesions. Mouth:  No deformity or lesions.  Oropharynx pink & moist. Neck:  Supple; no masses or thyromegaly. Lungs:  Clear throughout to auscultation.   No wheezes, crackles, or rhonchi. No acute distress. Heart:  Regular rate and rhythm; no murmurs, clicks, rubs,  or gallops. Extremities:  Without clubbing or edema. Neurologic:  Alert and  oriented x4;  grossly normal neurologically. Skin:  Intact without significant lesions or rashes. Psych:  Alert and cooperative. Normal mood and affect. Abdomen:  Soft,  nontender and nondistended. No masses, hepatosplenomegaly or hernias noted. Normal bowel sounds, without guarding, and without rebound.         Lab Results: Recent Labs    12/29/24 0920 12/29/24 1159 12/30/24 0032 12/30/24 0542  WBC 10.0  --   --  9.4  HGB 10.2* 10.0* 8.7* 8.6*  HCT 31.4* 31.1* 27.1* 26.9*  PLT 273  --   --  221   BMET Recent Labs    12/29/24 0920 12/30/24 0542  NA 139 141  K 3.7 3.5  CL 102 105  CO2 26 28  GLUCOSE 319* 182*  BUN 22* 17  CREATININE 0.92 0.75  CALCIUM  8.4* 8.1*   LFT Recent Labs    12/29/24 0920  PROT 6.5  ALBUMIN 3.7  AST 13*  ALT 10  ALKPHOS 87  BILITOT 0.4   PT/INR Recent Labs    12/29/24 0920  LABPROT 14.7  INR 1.1    Studies/Results: No results found.  Impression: Rectal bleeding?  Possibly diverticular in origin Hemoglobin on presentation was 10.2, subsequently 10/8.7/8.6 Hemodynamically stable  Comorbidities: Diabetes, dyslipidemia, basilar artery dissection, hypertension  Plan: Diagnostic colonoscopy in a.m.SABRA Clear liquid diet, split prep to be started today. The risks and the benefits of the procedure were discussed with the patient in details along with the husband present at bedside and ER. They understand and verbalized consent.   LOS: 0 days   Estelita Manas, MD  12/30/2024, 1:33 PM

## 2024-12-30 NOTE — H&P (View-Only) (Signed)
 [facility] Consult  Referring Provider: ER Primary Care Physician:  Neysa Tinnie BRAVO, PA Primary Gastroenterologist: [facility]  Reason for Consultation: Rectal bleeding  HPI: Diane Reese is a 54 y.o. female was in her usual state of health until Sunday evening when she had a bloody bowel movement.  She describes seeing bright red blood and had mild upper abdominal discomfort which she describes as bloating.  Throughout the night she had a total of 6 of those episodes, eventually started noting clots and then came to the ER at [facility].  She has not had a bowel movement since then. Denies abdominal pain. 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  She stays constipated and may have a bowel movement every 2 to 3 days but denies noticing blood in her stool before. Denies nausea, vomiting, acid reflux, heartburn, difficulty swallowing, pain on swallowing, unintentional weight loss or loss of appetite.  Previous GI workup: Colonoscopy, Dr. Dianna, screening, 02/2021: Diverticulosis in sigmoid, internal hemorrhoids, repeat recommended in 10 years Past Medical History:  Diagnosis Date   Dissection of basilar artery     Past Surgical History:  Procedure Laterality Date   ABDOMINAL HYSTERECTOMY      Prior to Admission medications  Medication Sig Start Date End Date Taking? Authorizing Provider  Vitamin D, Ergocalciferol, (DRISDOL) 1.25 MG (50000 UNIT) CAPS capsule Take 50,000 Units by mouth once a week. 10/31/24  Yes [provider]  ALPRAZolam  (XANAX ) 1 MG tablet Take 1 tablet (1 mg total) by mouth at bedtime as needed for anxiety (to take prior to MRI. Can take a second dose if needed.). Patient not taking: Reported on 12/29/2024 07/21/24   Camara, Amadou, MD  amLODipine  (NORVASC ) 5 MG tablet Take 1 tablet (5 mg total) by mouth daily. Patient  not taking: Reported on 12/29/2024 04/27/24 04/27/25  Raenelle Coria, MD  aspirin  EC 81 MG tablet Take 1 tablet (81 mg total) by mouth daily. Swallow whole. 04/28/24   Raenelle Coria, MD  clopidogrel  (PLAVIX ) 75 MG tablet Take 1 tablet (75 mg total) by mouth daily. Patient not taking: Reported on 12/29/2024 04/28/24   Raenelle Coria, MD  metFORMIN  (GLUCOPHAGE ) 500 MG tablet Take 1 tablet (500 mg total) by mouth 2 (two) times daily with a meal. Patient not taking: Reported on 12/29/2024 04/28/24   Raenelle Coria, MD    Current Facility-Administered Medications  Medication Dose Route Frequency Provider Last Rate Last Admin   hydrALAZINE  (APRESOLINE ) injection 10 mg  10 mg Intravenous Q6H PRN Fausto Sor A, DO       insulin  aspart (novoLOG ) injection 0-9 Units  0-9 Units Subcutaneous Q4H Fausto Sor A, DO   1 Units at 12/30/24 1233   lactated ringers  infusion   Intravenous Continuous Fausto Sor A, DO 75 mL/hr at 12/30/24 0436 New Bag at 12/30/24 0436   pantoprazole  (PROTONIX ) injection 40 mg  40 mg Intravenous Q12H Fausto Sor A, DO   40 mg at 12/29/24 2215   Current Outpatient Medications  Medication Sig Dispense Refill   Vitamin D, Ergocalciferol, (DRISDOL) 1.25 MG (50000 UNIT) CAPS capsule Take 50,000 Units by mouth once a week.     ALPRAZolam  (XANAX ) 1 MG tablet Take 1 tablet (1 mg total) by mouth at bedtime as needed for anxiety (to take prior to MRI. Can take a second dose if needed.). (Patient not taking:  Reported on 12/29/2024) 2 tablet 0   amLODipine  (NORVASC ) 5 MG tablet Take 1 tablet (5 mg total) by mouth daily. (Patient not taking: Reported on 12/29/2024) 30 tablet 11   aspirin  EC 81 MG tablet Take 1 tablet (81 mg total) by mouth daily. Swallow whole. 30 tablet 12   clopidogrel  (PLAVIX ) 75 MG tablet Take 1 tablet (75 mg total) by mouth daily. (Patient not taking: Reported on 12/29/2024) 90 tablet 0   metFORMIN  (GLUCOPHAGE ) 500 MG tablet Take 1 tablet (500 mg total) by mouth 2  (two) times daily with a meal. (Patient not taking: Reported on 12/29/2024) 180 tablet 0    Allergies as of 12/29/2024 - Review Complete 12/29/2024  Allergen Reaction Noted   Penicillins Hives 08/07/2013    Family History  Problem Relation Age of Onset   Stroke Sister     Social History   Socioeconomic History   Marital status: Married    Spouse name: Not on file   Number of children: Not on file   Years of education: Not on file   Highest education level: Not on file  Occupational History   Not on file  Tobacco Use   Smoking status: Never   Smokeless tobacco: Never  Substance and Sexual Activity   Alcohol use: No   Drug use: No   Sexual activity: Yes    Birth control/protection: Surgical  Other Topics Concern   Not on file  Social History Narrative   Not on file   Social Drivers of Health   Tobacco Use: Low Risk (12/29/2024)   Patient History    Smoking Tobacco Use: Never    Smokeless Tobacco Use: Never    Passive Exposure: Not on file  Financial Resource Strain: Low Risk (05/14/2024)   Received from [facility]   Overall Financial Resource Strain (CARDIA)    Difficulty of Paying Living Expenses: Not hard at all  Food Insecurity: No Food Insecurity (05/14/2024)   Received from [facility]   Epic    Within the past 12 months, you worried that your food would run out before you got the money to buy more.: Never true    Within the past 12 months, the food you bought just didn't last and you didn't have money to get more.: Never true  Transportation Needs: No Transportation Needs (05/14/2024)   Received from [facility] - Transportation    Lack of Transportation (Non-Medical): No    Lack of Transportation (Medical): No  Recent Concern: Transportation Needs - Unmet Transportation Needs (04/26/2024)   PRAPARE - Administrator, Civil Service (Medical): Yes    Lack of Transportation (Non-Medical): Yes  Physical Activity: Not on file  Stress:  Not on file  Social Connections: Not on file  Intimate Partner Violence: Not At Risk (04/26/2024)   Humiliation, Afraid, Rape, and Kick questionnaire    Fear of Current or Ex-Partner: No    Emotionally Abused: No    Physically Abused: No    Sexually Abused: No  Depression (PHQ2-9): Not on file  Alcohol Screen: Not on file  Housing: Low Risk (05/14/2024)   Received from [facility]   Epic    At any time in the past 12 months, were you homeless or living in a shelter (including now)?: No    In the past 12 months, how many times have you moved where you were living?: 1    In the last 12 months, was there a time when  you were not able to pay the mortgage or rent on time?: No  Utilities: Not At Risk (05/14/2024)   Received from [facility] Utilities    Threatened with loss of utilities: No  Health Literacy: Not on file    Review of Systems: As per HPI Physical Exam: Vital signs in last 24 hours: Temp:  [98.3 F (36.8 C)-99 F (37.2 C)] 98.3 F (36.8 C) (01/13 1040) Pulse Rate:  [81-95] 92 (01/13 1040) Resp:  [13-23] 16 (01/13 1040) BP: (104-138)/(43-82) 137/72 (01/13 1040) SpO2:  [95 %-100 %] 100 % (01/13 1040) Last BM Date : 12/29/24  General:   Alert,  Well-developed, overweight, pleasant and cooperative in NAD Head:  Normocephalic and atraumatic. Eyes:  Sclera clear, no icterus.   Mild pallor Ears:  Normal auditory acuity. Nose:  No deformity, discharge,  or lesions. Mouth:  No deformity or lesions.  Oropharynx pink & moist. Neck:  Supple; no masses or thyromegaly. Lungs:  Clear throughout to auscultation.   No wheezes, crackles, or rhonchi. No acute distress. Heart:  Regular rate and rhythm; no murmurs, clicks, rubs,  or gallops. Extremities:  Without clubbing or edema. Neurologic:  Alert and  oriented x4;  grossly normal neurologically. Skin:  Intact without significant lesions or rashes. Psych:  Alert and cooperative. Normal mood and affect. Abdomen:  Soft,  nontender and nondistended. No masses, hepatosplenomegaly or hernias noted. Normal bowel sounds, without guarding, and without rebound.         Lab Results: Recent Labs    12/29/24 0920 12/29/24 1159 12/30/24 0032 12/30/24 0542  WBC 10.0  --   --  9.4  HGB 10.2* 10.0* 8.7* 8.6*  HCT 31.4* 31.1* 27.1* 26.9*  PLT 273  --   --  221   BMET Recent Labs    12/29/24 0920 12/30/24 0542  NA 139 141  K 3.7 3.5  CL 102 105  CO2 26 28  GLUCOSE 319* 182*  BUN 22* 17  CREATININE 0.92 0.75  CALCIUM  8.4* 8.1*   LFT Recent Labs    12/29/24 0920  PROT 6.5  ALBUMIN 3.7  AST 13*  ALT 10  ALKPHOS 87  BILITOT 0.4   PT/INR Recent Labs    12/29/24 0920  LABPROT 14.7  INR 1.1    Studies/Results: No results found.  Impression: Rectal bleeding?  Possibly diverticular in origin Hemoglobin on presentation was 10.2, subsequently 10/8.7/8.6 Hemodynamically stable  Comorbidities: Diabetes, dyslipidemia, basilar artery dissection, hypertension  Plan: Diagnostic colonoscopy in a.m.SABRA Clear liquid diet, split prep to be started today. The risks and the benefits of the procedure were discussed with the patient in details along with the husband present at bedside and ER. They understand and verbalized consent.   LOS: 0 days   Estelita Manas, MD  12/30/2024, 1:33 PM

## 2024-12-31 ENCOUNTER — Encounter (HOSPITAL_COMMUNITY): Admission: EM | Disposition: A | Payer: Self-pay | Source: Home / Self Care | Attending: Emergency Medicine

## 2024-12-31 ENCOUNTER — Inpatient Hospital Stay (HOSPITAL_COMMUNITY): Payer: Self-pay | Admitting: Anesthesiology

## 2024-12-31 ENCOUNTER — Encounter (HOSPITAL_COMMUNITY): Payer: Self-pay | Admitting: Anesthesiology

## 2024-12-31 ENCOUNTER — Encounter (HOSPITAL_COMMUNITY)
Admission: EM | Disposition: A | Discharge status: Home / Self Care | Payer: Self-pay | Source: Home / Self Care | Attending: Internal Medicine

## 2024-12-31 ENCOUNTER — Encounter (HOSPITAL_COMMUNITY): Payer: Self-pay | Admitting: Internal Medicine

## 2024-12-31 DIAGNOSIS — K5730 Diverticulosis of large intestine without perforation or abscess without bleeding: Secondary | ICD-10-CM | POA: Diagnosis not present

## 2024-12-31 DIAGNOSIS — E785 Hyperlipidemia, unspecified: Secondary | ICD-10-CM | POA: Diagnosis not present

## 2024-12-31 DIAGNOSIS — I10 Essential (primary) hypertension: Secondary | ICD-10-CM | POA: Diagnosis not present

## 2024-12-31 DIAGNOSIS — E1165 Type 2 diabetes mellitus with hyperglycemia: Secondary | ICD-10-CM

## 2024-12-31 DIAGNOSIS — K625 Hemorrhage of anus and rectum: Secondary | ICD-10-CM

## 2024-12-31 HISTORY — PX: COLONOSCOPY: SHX5424

## 2024-12-31 LAB — BASIC METABOLIC PANEL WITH GFR
Anion gap: 9 (ref 5–15)
BUN: 9 mg/dL (ref 6–20)
CO2: 25 mmol/L (ref 22–32)
Calcium: 8.1 mg/dL — ABNORMAL LOW (ref 8.9–10.3)
Chloride: 106 mmol/L (ref 98–111)
Creatinine, Ser: 0.72 mg/dL (ref 0.44–1.00)
GFR, Estimated: >60 mL/min
Glucose, Bld: 141 mg/dL — ABNORMAL HIGH (ref 70–99)
Potassium: 3.5 mmol/L (ref 3.5–5.1)
Sodium: 140 mmol/L (ref 135–145)

## 2024-12-31 LAB — CBC WITH DIFFERENTIAL/PLATELET
Abs Immature Granulocytes: 0.03 K/uL (ref 0.00–0.07)
Basophils Absolute: 0 K/uL (ref 0.0–0.1)
Basophils Relative: 0 %
Eosinophils Absolute: 0.2 K/uL (ref 0.0–0.5)
Eosinophils Relative: 3 %
HCT: 27 % — ABNORMAL LOW (ref 36.0–46.0)
Hemoglobin: 8.7 g/dL — ABNORMAL LOW (ref 12.0–15.0)
Immature Granulocytes: 0 %
Lymphocytes Relative: 25 %
Lymphs Abs: 2.3 K/uL (ref 0.7–4.0)
MCH: 27.9 pg (ref 26.0–34.0)
MCHC: 32.2 g/dL (ref 30.0–36.0)
MCV: 86.5 fL (ref 80.0–100.0)
Monocytes Absolute: 0.5 K/uL (ref 0.1–1.0)
Monocytes Relative: 6 %
Neutro Abs: 6 K/uL (ref 1.7–7.7)
Neutrophils Relative %: 66 %
Platelets: 244 K/uL (ref 150–400)
RBC: 3.12 MIL/uL — ABNORMAL LOW (ref 3.87–5.11)
RDW: 12.7 % (ref 11.5–15.5)
WBC: 9.1 K/uL (ref 4.0–10.5)
nRBC: 0 % (ref 0.0–0.2)

## 2024-12-31 LAB — CBC
HCT: 28.6 % — ABNORMAL LOW (ref 36.0–46.0)
Hemoglobin: 9.1 g/dL — ABNORMAL LOW (ref 12.0–15.0)
MCH: 27.7 pg (ref 26.0–34.0)
MCHC: 31.8 g/dL (ref 30.0–36.0)
MCV: 87.2 fL (ref 80.0–100.0)
Platelets: 200 K/uL (ref 150–400)
RBC: 3.28 MIL/uL — ABNORMAL LOW (ref 3.87–5.11)
RDW: 12.5 % (ref 11.5–15.5)
WBC: 8.5 K/uL (ref 4.0–10.5)
nRBC: 0 % (ref 0.0–0.2)

## 2024-12-31 LAB — GLUCOSE, CAPILLARY
Glucose-Capillary: 114 mg/dL — ABNORMAL HIGH (ref 70–99)
Glucose-Capillary: 123 mg/dL — ABNORMAL HIGH (ref 70–99)
Glucose-Capillary: 126 mg/dL — ABNORMAL HIGH (ref 70–99)
Glucose-Capillary: 136 mg/dL — ABNORMAL HIGH (ref 70–99)
Glucose-Capillary: 138 mg/dL — ABNORMAL HIGH (ref 70–99)
Glucose-Capillary: 181 mg/dL — ABNORMAL HIGH (ref 70–99)
Glucose-Capillary: 262 mg/dL — ABNORMAL HIGH (ref 70–99)

## 2024-12-31 LAB — MRSA NEXT GEN BY PCR, NASAL: MRSA by PCR Next Gen: NOT DETECTED

## 2024-12-31 LAB — TYPE AND SCREEN
ABO/RH(D): B POS
Antibody Screen: NEGATIVE

## 2024-12-31 LAB — ABO/RH: ABO/RH(D): B POS

## 2024-12-31 LAB — MAGNESIUM: Magnesium: 1.8 mg/dL (ref 1.7–2.4)

## 2024-12-31 MED ORDER — ACETAMINOPHEN 500 MG PO TABS
1000.0000 mg | ORAL_TABLET | Freq: Four times a day (QID) | ORAL | Status: DC | PRN
  Administered 2024-12-31: 1000 mg via ORAL
  Filled 2024-12-31: qty 2

## 2024-12-31 MED ORDER — PROPOFOL 10 MG/ML IV BOLUS
INTRAVENOUS | Status: DC | PRN
  Administered 2024-12-31 (×2): 30 mg via INTRAVENOUS

## 2024-12-31 MED ORDER — SODIUM CHLORIDE 0.9 % IV SOLN: INTRAVENOUS | Status: DC

## 2024-12-31 MED ORDER — LACTATED RINGERS IV SOLN: INTRAVENOUS | Status: AC

## 2024-12-31 MED ORDER — SODIUM CHLORIDE 0.9 % IV SOLN
INTRAVENOUS | Status: AC | PRN
  Administered 2024-12-31: 500 mL via INTRAVENOUS

## 2024-12-31 MED ORDER — MAGNESIUM SULFATE IN D5W 1-5 GM/100ML-% IV SOLN
1.0000 g | Freq: Once | INTRAVENOUS | Status: AC
  Administered 2024-12-31: 1 g via INTRAVENOUS
  Filled 2024-12-31: qty 100

## 2024-12-31 MED ORDER — PROPOFOL 500 MG/50ML IV EMUL
INTRAVENOUS | Status: DC | PRN
  Administered 2024-12-31: 120 ug/kg/min via INTRAVENOUS

## 2024-12-31 MED ORDER — POTASSIUM CHLORIDE CRYS ER 20 MEQ PO TBCR
40.0000 meq | EXTENDED_RELEASE_TABLET | Freq: Once | ORAL | Status: AC
  Administered 2024-12-31: 40 meq via ORAL
  Filled 2024-12-31 (×2): qty 2

## 2024-12-31 NOTE — Progress Notes (Signed)
 "                                                                                                                                                                                                                                                                                PROGRESS NOTE     Patient Demographics:    Diane Reese, is a 54 y.o. female, DOB - March 18, 1971, FMW:[id number]  Outpatient Primary MD for the patient is Neysa Tinnie BRAVO, PA    LOS - 0  Admit date - 12/29/2024    Chief Complaint  Patient presents with   Rectal Bleeding       Brief Narrative (HPI from H&P)   54 year old female with history of hypertension, diabetes mellitus type 2, hyperlipidemia, basilar artery dissection takes aspirin  on a daily basis scented with dark stools per rectum. On presentation, hemoglobin was 10.2, down from 13 from May 2025. GI was consulted.    Subjective:    Diane Reese today has, No headache, No chest pain, No abdominal pain - No Nausea, No new weakness tingling or numbness, no cough or shortness of breath.   Assessment  & Plan :   Acute GI bleed with acute blood loss related anemia.  Patient does take aspirin  every day and had some upper GI discomfort prior to having dark stools and some bright red blood in stool as well, continue to monitor H&H, type screen done, aspirin  held, IV PPI, GI on board, defer endoscopy to GI.  Of note initial BUN was slightly elevated.  Continue to monitor CBC may require transfusion if H&H continues to trend down.   Dyslipidemia.  Crestor  once taking orally.  Essential hypertension.  Blood pressure stable currently on as needed hydralazine .  History of basilar artery dissection.  Currently on aspirin  daily, on hold due to #1 above.  Obesity.  BMI greater than 40.  Follow-up with PCP for weight loss.  DM type II.  On SSI.  Her outpatient glycemic control as evidenced by high A1c  Lab Results  Component Value Date   HGBA1C 11.0 (H) 12/29/2024   CBG  (last 3)  Recent Labs    12/30/24 2324 12/31/24 0422 12/31/24  0750  GLUCAP 185* 138* 136*        Condition - Extremely Guarded  Family Communication  : Family member bedside  Code Status : Full code  Consults  : [facility]  PUD Prophylaxis : PPI   Procedures  :            Disposition Plan  :    Status is: Observation   DVT Prophylaxis  :    Place and maintain sequential compression device Start: 12/31/24 0654 Place and maintain sequential compression device Start: 12/31/24 0536    Lab Results  Component Value Date   PLT 244 12/31/2024    Diet :  Diet Order             Diet NPO time specified  Diet effective now                    Inpatient Medications  Scheduled Meds:  bisacodyl   10 mg Oral Once   insulin  aspart  0-9 Units Subcutaneous Q4H   pantoprazole  (PROTONIX ) IV  40 mg Intravenous Q12H   potassium chloride   40 mEq Oral Once   Continuous Infusions:  lactated ringers      magnesium  sulfate bolus IVPB     PRN Meds:.hydrALAZINE   Antibiotics  :    Anti-infectives (From admission, onward)    None         Objective:   Vitals:   12/30/24 2329 12/31/24 0348 12/31/24 0442 12/31/24 0700  BP: (!) 148/77  (!) 134/93 (!) 113/57  Pulse: (!) 103 93 90   Resp: (!) 25 19    Temp: 98.8 F (37.1 C)  98.2 F (36.8 C) 98.3 F (36.8 C)  TempSrc: Oral  Oral Oral  SpO2: 97% 98% 100%   Weight: 102.1 kg     Height: 5' 1 (1.549 m)       Wt Readings from Last 3 Encounters:  12/30/24 102.1 kg  06/18/24 106.6 kg  04/26/24 107 kg    No intake or output data in the 24 hours ending 12/31/24 0843   Physical Exam  Awake Alert, No new F.N deficits, Normal affect [place].AT,PERRAL Supple Neck, No JVD,   Symmetrical Chest wall movement, Good air movement bilaterally, CTAB RRR,No Gallops,Rubs or new Murmurs,  +ve B.Sounds, Abd Soft, No tenderness,   No Cyanosis, Clubbing or edema       Data Review:    Recent Labs  Lab 12/29/24 0920  12/29/24 1159 12/30/24 0032 12/30/24 0542 12/31/24 0345  WBC 10.0  --   --  9.4 9.1  HGB 10.2* 10.0* 8.7* 8.6* 8.7*  HCT 31.4* 31.1* 27.1* 26.9* 27.0*  PLT 273  --   --  221 244  MCV 85.3  --   --  87.3 86.5  MCH 27.7  --   --  27.9 27.9  MCHC 32.5  --   --  32.0 32.2  RDW 12.5  --   --  12.8 12.7  LYMPHSABS  --   --   --  2.8 2.3  MONOABS  --   --   --  0.5 0.5  EOSABS  --   --   --  0.3 0.2  BASOSABS  --   --   --  0.0 0.0    Recent Labs  Lab 12/29/24 0920 12/30/24 0542 12/31/24 0345  NA 139 141 140  K 3.7 3.5 3.5  CL 102 105 106  CO2 26 28 25   ANIONGAP 11 8 9  GLUCOSE 319* 182* 141*  BUN 22* 17 9  CREATININE 0.92 0.75 0.72  AST 13*  --   --   ALT 10  --   --   ALKPHOS 87  --   --   BILITOT 0.4  --   --   ALBUMIN 3.7  --   --   INR 1.1  --   --   HGBA1C 11.0*  --   --   MG  --   --  1.8  CALCIUM  8.4* 8.1* 8.1*      Recent Labs  Lab 12/29/24 0920 12/30/24 0542 12/31/24 0345  INR 1.1  --   --   HGBA1C 11.0*  --   --   MG  --   --  1.8  CALCIUM  8.4* 8.1* 8.1*    --------------------------------------------------------------------------------------------------------------- Lab Results  Component Value Date   CHOL 167 04/27/2024   HDL 45 04/27/2024   LDLCALC 102 (H) 04/27/2024   TRIG 102 04/27/2024   CHOLHDL 3.7 04/27/2024    Lab Results  Component Value Date   HGBA1C 11.0 (H) 12/29/2024   No results for input(s): TSH, T4TOTAL, FREET4, T3FREE, THYROIDAB in the last 72 hours. No results for input(s): VITAMINB12, FOLATE, FERRITIN, TIBC, IRON, RETICCTPCT in the last 72 hours. ------------------------------------------------------------------------------------------------------------------ Cardiac Enzymes No results for input(s): CKMB, TROPONINI, MYOGLOBIN in the last 168 hours.  Invalid input(s): CK  Micro Results No results found for this or any previous visit (from the past 240 hours).  Radiology Report No  results found.   Signature  -   Lavada Stank M.D on 12/31/2024 at 8:43 AM   -  To page go to [email or web address]   "

## 2024-12-31 NOTE — Progress Notes (Signed)
" ° ° °  PROCEDURAL EXPEDITER PROGRESS NOTE  Patient Name: Diane Reese  DOB:10-Aug-1971 Date of Admission: 12/29/2024  Date of Assessment:12/31/2024   -------------------------------------------------------------------------------------------------------------------   Brief clinical summary: pt is 54 yr old female with Hx of type 2DM, HTN, hyperli;idemia, basilar artery dissectiontakes aspirin  daily.  Pt having a Colonoscpy today for rectal bleeding   Orders in place:  Yes   Communication with surgical team if no orders: n/s  Labs, test, and orders reviewed: yes  Requires surgical clearance:  No  What type of clearance: n/a  Clearance received: n/a  Barriers noted:n/a   Intervention provided by [facility] team: n/a  Barrier resolved:  not applicable   -------------------------------------------------------------------------------------------------------------------  [employer], Ronal DELENA Bald Please contact us  directly via secure chat (search for [facility]) or by calling us  at [phone] [facility]  "

## 2024-12-31 NOTE — Interval H&P Note (Signed)
 History and Physical Interval Note: 53/female with rectal bleeding for a colonoscopy with propofol .  12/31/2024 12:49 PM  [name]  has presented today for colonoscopy with propofol , with the diagnosis of rectal bleeding.  The various methods of treatment have been discussed with the patient and family. After consideration of risks, benefits and other options for treatment, the patient has consented to  Procedures: COLONOSCOPY (N/A) as a surgical intervention.  The patient's history has been reviewed, patient examined, no change in status, stable for surgery.  I have reviewed the patient's chart and labs.  Questions were answered to the patient's satisfaction.     Diane Reese

## 2024-12-31 NOTE — Anesthesia Postprocedure Evaluation (Signed)
"   Anesthesia Post Note  Patient: Diane Reese  Procedure(s) Performed: COLONOSCOPY     Patient location during evaluation: PACU Anesthesia Type: MAC Level of consciousness: awake and alert Pain management: pain level controlled Vital Signs Assessment: post-procedure vital signs reviewed and stable Respiratory status: spontaneous breathing, nonlabored ventilation, respiratory function stable and patient connected to nasal cannula oxygen Cardiovascular status: stable and blood pressure returned to baseline Postop Assessment: no apparent nausea or vomiting Anesthetic complications: no   No notable events documented.  Last Vitals:  Vitals:   12/31/24 1500 12/31/24 1643  BP:  (!) 147/81  Pulse:  91  Resp:  18  Temp: (!) 36.4 C 36.6 C  SpO2:  98%    Last Pain:  Vitals:   12/31/24 2003  TempSrc:   PainSc: 0-No pain   Pain Goal:                   Shekira Drummer      "

## 2024-12-31 NOTE — Anesthesia Preprocedure Evaluation (Addendum)
"                                    Anesthesia Evaluation  Patient identified by MRN, date of birth, ID band Patient awake    Reviewed: Allergy & Precautions, H&P , NPO status , Patient's Chart, lab work & pertinent test results  Airway Mallampati: II  TM Distance: >3 FB Neck ROM: Full    Dental no notable dental hx. (+) Teeth Intact, Dental Advisory Given   Pulmonary neg pulmonary ROS   Pulmonary exam normal breath sounds clear to auscultation       Cardiovascular Exercise Tolerance: Good hypertension, Pt. on medications negative cardio ROS Normal cardiovascular exam Rhythm:Regular Rate:Normal  Echo 25 1. Left ventricular ejection fraction, by estimation, is 60 to 65%. The  left ventricle has normal function. The left ventricle has no regional  wall motion abnormalities. There is mild left ventricular hypertrophy.  Left ventricular diastolic parameters  were normal.   2. Right ventricular systolic function was not well visualized. The right  ventricular size is not well visualized. Tricuspid regurgitation signal is  inadequate for assessing PA pressure.   3. The mitral valve is normal in structure. Trivial mitral valve  regurgitation. No evidence of mitral stenosis.   4. The aortic valve is tricuspid. Aortic valve regurgitation is not  visualized. No aortic stenosis is present.   5. The inferior vena cava is normal in size with greater than 50%  respiratory variability, suggesting right atrial pressure of 3 mmHg.      Neuro/Psych negative neurological ROS  negative psych ROS   GI/Hepatic negative GI ROS, Neg liver ROS,,,  Endo/Other  diabetes, Type 2  Class 3 obesity  Renal/GU negative Renal ROS  negative genitourinary   Musculoskeletal negative musculoskeletal ROS (+)    Abdominal   Peds negative pediatric ROS (+)  Hematology negative hematology ROS (+) Blood dyscrasia, anemia   Anesthesia Other Findings   Reproductive/Obstetrics negative  OB ROS                              Anesthesia Physical Anesthesia Plan  ASA: 3  Anesthesia Plan: MAC   Post-op Pain Management: Minimal or no pain anticipated   Induction: Intravenous  PONV Risk Score and Plan: 2 and Propofol  infusion and Treatment may vary due to age or medical condition  Airway Management Planned: Natural Airway and Simple Face Mask  Additional Equipment: None  Intra-op Plan:   Post-operative Plan:   Informed Consent: I have reviewed the patients History and Physical, chart, labs and discussed the procedure including the risks, benefits and alternatives for the proposed anesthesia with the patient or authorized representative who has indicated his/her understanding and acceptance.       Plan Discussed with: Anesthesiologist and CRNA  Anesthesia Plan Comments:          Anesthesia Quick Evaluation  "

## 2024-12-31 NOTE — Transfer of Care (Signed)
 Immediate Anesthesia Transfer of Care Note  Patient: Diane Reese  Procedure(s) Performed: COLONOSCOPY  Patient Location: PACU  Anesthesia Type:MAC  Level of Consciousness: awake and drowsy  Airway & Oxygen Therapy: Patient Spontanous Breathing and Patient connected to face mask oxygen  Post-op Assessment: Report given to RN and Post -op Vital signs reviewed and stable  Post vital signs: Reviewed and stable  Last Vitals:  Vitals Value Taken Time  BP 123/63 12/31/24 14:18  Temp 36.6 C 12/31/24 13:36  Pulse 87 12/31/24 14:23  Resp 15 12/31/24 14:23  SpO2 99 % 12/31/24 14:23  Vitals shown include unfiled device data.  Last Pain:  Vitals:   12/31/24 1400  TempSrc:   PainSc: 0-No pain         Complications: No notable events documented.

## 2024-12-31 NOTE — Plan of Care (Signed)

## 2024-12-31 NOTE — Op Note (Signed)
 [facility] Patient Name: Diane Reese Procedure Date : 12/31/2024 MRN: [id number] Attending MD: Estelita Manas , MD, [id number] Date of Birth: January 30, 1971 CSN: [id number] Age: 54 Admit Type: Inpatient Procedure:                Colonoscopy Indications:              Rectal bleeding Providers:                Estelita Manas, MD, Robie Breed, RN, Corene Southgate, Technician Referring MD:             [facility] Medicines:                Monitored Anesthesia Care Complications:            No immediate complications. Estimated Blood Loss:     Estimated blood loss: none. Procedure:                Pre-Anesthesia Assessment:                           - Prior to the procedure, a History and Physical                            was performed, and patient medications and                            allergies were reviewed. The patient's tolerance of                            previous anesthesia was also reviewed. The risks                            and benefits of the procedure and the sedation                            options and risks were discussed with the patient.                            All questions were answered, and informed consent                            was obtained. Prior Anticoagulants: The patient has                            taken no anticoagulant or antiplatelet agents                            except for aspirin . ASA Grade Assessment: III - A                            patient with severe systemic disease. After                            reviewing  the risks and benefits, the patient was                            deemed in satisfactory condition to undergo the                            procedure.                           After obtaining informed consent, the colonoscope                            was passed under direct vision. Throughout the                            procedure, the patient's blood pressure, pulse, and                             oxygen saturations were monitored continuously. The                            CF-HQ190L ([id number]) Olympus colonoscopy was                            introduced through the anus and advanced to the the                            terminal ileum. The colonoscopy was performed                            without difficulty. The patient tolerated the                            procedure well. The quality of the bowel                            preparation was good. The terminal ileum, ileocecal                            valve, appendiceal orifice, and rectum were                            photographed. Scope In: 1:20:19 PM Scope Out: 1:31:12 PM Scope Withdrawal Time: 0 hours 9 minutes 11 seconds  Total Procedure Duration: 0 hours 10 minutes 53 seconds  Findings:      Hemorrhoids were found on perianal exam.      The terminal ileum appeared normal.      Scattered medium-mouthed and small-mouthed diverticula were found in the       sigmoid colon, descending colon, transverse colon and hepatic flexure.      Non-bleeding internal hemorrhoids were found during retroflexion. Impression:               - Hemorrhoids found on perianal exam.                           -  The examined portion of the ileum was normal.                           - Diverticulosis in the sigmoid colon, in the                            descending colon, in the transverse colon and at                            the hepatic flexure.                           - Non-bleeding internal hemorrhoids.                           - No specimens collected. Moderate Sedation:      Patient did not receive moderate sedation for this procedure, but       instead received monitored anesthesia care. Recommendation:           - High fiber diet.                           - Continue present medications.                           - Repeat colonoscopy in 10 years for screening                             purposes. Procedure Code(s):        --- Professional ---                           [phone], Colonoscopy, flexible; diagnostic, including                            collection of specimen(s) by brushing or washing,                            when performed (separate procedure) Diagnosis Code(s):        --- Professional ---                           K64.8, Other hemorrhoids                           K62.5, Hemorrhage of anus and rectum                           K57.30, Diverticulosis of large intestine without                            perforation or abscess without bleeding CPT copyright 2022 American Medical Association. All rights reserved. The codes documented in this report are preliminary and upon coder review may  be revised to meet current compliance requirements. Estelita Manas, MD 12/31/2024 1:37:38 PM This report has been signed electronically. Number of Addenda: 0

## 2024-12-31 NOTE — TOC Initial Note (Signed)
 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  Patient reports onset of bleeding 8:30 PM and has had 6 bloody bowel movements since that time.  She reports having to strain for bowel movements recently.  Has had some mild intermittent epigastric pain.   Patient lives with spouse, who provides support at home and he will be transportation home at discharge.   Patient has PCP, manages medication at home, no home DME.   No insurance listed for patient, she reports having [employer] through her husband and provided CM with copy of card. CM did send to [employer] in financial counseling to verify.   CM will continue to follow.     Expected Discharge Plan: Home/Self Care Barriers to Discharge: Continued Medical Work up   Patient Goals and CMS Choice            Expected Discharge Plan and Services       Living arrangements for the past 2 months: Single Family Home                                      Prior Living Arrangements/Services Living arrangements for the past 2 months: Single Family Home Lives with:: Self, Spouse Patient language and need for interpreter reviewed:: Yes Do you feel safe going back to the place where you live?: Yes      Need for Family Participation in Patient Care: Yes (Comment) Care giver support system in place?: Yes (comment)   Criminal Activity/Legal Involvement Pertinent to Current Situation/Hospitalization: No - Comment as needed  Activities of Daily Living   ADL Screening (condition at time of admission) Independently performs ADLs?: Yes (appropriate for developmental  age) Is the patient deaf or have difficulty hearing?: No Does the patient have difficulty seeing, even when wearing glasses/contacts?: No Does the patient have difficulty concentrating, remembering, or making decisions?: No  Permission Sought/Granted                  Emotional Assessment Appearance:: Developmentally appropriate Attitude/Demeanor/Rapport: Engaged Affect (typically observed): Calm Orientation: : Oriented to Self, Oriented to Place, Oriented to  Time, Oriented to Situation Alcohol / Substance Use: Not Applicable Psych Involvement: No (comment)  Admission diagnosis:  Rectal bleeding [K62.5] Patient Active Problem List   Diagnosis Date Noted   Rectal bleeding 12/29/2024   Acute blood loss anemia (ABLA) 12/29/2024   Type 2 diabetes mellitus with hyperglycemia (HCC) 12/29/2024   Essential hypertension 12/29/2024   Hyperlipidemia 12/29/2024   Disorder of basilar artery 04/26/2024   PCP:  Neysa Tinnie BRAVO, PA Pharmacy:   [facility] [id number] - [place], [place] - [address] AT [facility] [address] [place] [address] Phone: [phone] Fax: [phone]  [facility] [place] [facility] [address], [address] [place] [address] Phone: [phone] Fax: [phone]     Social Drivers of Health (SDOH) Social History: SDOH Screenings  Food Insecurity: No Food Insecurity (12/30/2024)  Housing: Low Risk (12/30/2024)  Transportation Needs: No Transportation Needs (12/30/2024)  Utilities: Not At Risk (12/30/2024)  Financial Resource Strain: Low Risk (05/14/2024)   Received from [facility]  Tobacco Use: Low Risk (12/29/2024)   SDOH Interventions: Housing Interventions: Intervention Not Indicated Transportation Interventions: Intervention Not Indicated Utilities Interventions: Patient Declined   Readmission Risk Interventions     No data to display

## 2024-12-31 NOTE — Evaluation (Signed)
 Physical Therapy Evaluation Patient Details Name: Diane Reese MRN: [id number] DOB: [birth year] Today's Date: 12/31/2024  History of Present Illness  54 y.o female presents to [facility] on 1/12 for abdominal discomfort and rectal bleeding. Pending colonoscopy on 1/14. PMH: HTN, DM2, HLD, basilar artery dissection.  Clinical Impression  Pt is currently mobilizing at her baseline without balance concerns. Pt educated effects blood loss could have on mobility, including fatigue and weakness. Pt demonstrates independent bed mobility, transfers, and ambulation without LOB or safety concerns. No dizziness noted throughout mobility. Pt does not have stairs to complete at home. Pt does not have further PT concerns at this time. PT to sign off. Re-consult if mobility needs change.         If plan is discharge home, recommend the following: Other (comment) (No additional requirements to return home.)   Can travel by private vehicle        Equipment Recommendations None recommended by PT  Recommendations for Other Services       Functional Status Assessment Patient has not had a recent decline in their functional status     Precautions / Restrictions Precautions Precautions: None Recall of Precautions/Restrictions: Intact Restrictions Weight Bearing Restrictions Per Provider Order: No      Mobility  Bed Mobility Overal bed mobility: Independent             General bed mobility comments: No bed mobility concerns.    Transfers Overall transfer level: Independent Equipment used: None               General transfer comment: Steady upon STS, demonstrates good eccentric control when returning to sit. No transfer concerns.    Ambulation/Gait Ambulation/Gait assistance: Independent Gait Distance (Feet): 150 Feet Assistive device: None Gait Pattern/deviations: WFL(Within Functional Limits)   Gait velocity interpretation: >4.37 ft/sec, indicative of normal walking speed   General  Gait Details: Pt steady throughout ambulation without LOB. Able to stay on steady line of progression. Pt safe with independent activity.  Stairs            Wheelchair Mobility     Tilt Bed    Modified Rankin (Stroke Patients Only)       Balance Overall balance assessment: Independent                                           Pertinent Vitals/Pain Pain Assessment Pain Assessment: No/denies pain    Home Living Family/patient expects to be discharged to:: Private residence Living Arrangements: Spouse/significant other Available Help at Discharge: Family;Available PRN/intermittently Type of Home: House Home Access: Stairs to enter   Entrance Stairs-Number of Steps: 1   Home Layout: One level Home Equipment: None      Prior Function Prior Level of Function : Independent/Modified Independent;Driving;Working/employed             Mobility Comments: Independent, does not use DME. 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 Independent with ADLs.     Extremity/Trunk Assessment   Upper Extremity Assessment Upper Extremity Assessment: Defer to OT evaluation    Lower Extremity Assessment Lower Extremity Assessment: Overall WFL for tasks assessed    Cervical / Trunk Assessment Cervical / Trunk Assessment: Normal  Communication   Communication Communication: No apparent difficulties    Cognition Arousal: Alert Behavior During Therapy: WFL for tasks assessed/performed   PT - Cognitive impairments: No apparent impairments  Following commands: Intact       Cueing Cueing Techniques: Verbal cues, Visual cues     General Comments General comments (skin integrity, edema, etc.): VSS throughout. No skin abnormalities noted. Pending colonoscopy today.    Exercises     Assessment/Plan    PT Assessment Patient does not need any further PT services  PT Problem List         PT Treatment Interventions      PT  Goals (Current goals can be found in the Care Plan section)  Acute Rehab PT Goals Patient Stated Goal: None stated    Frequency       Co-evaluation               AM-PAC PT 6 Clicks Mobility  Outcome Measure Help needed turning from your back to your side while in a flat bed without using bedrails?: None Help needed moving from lying on your back to sitting on the side of a flat bed without using bedrails?: None Help needed moving to and from a bed to a chair (including a wheelchair)?: None Help needed standing up from a chair using your arms (e.g., wheelchair or bedside chair)?: None Help needed to walk in hospital room?: None Help needed climbing 3-5 steps with a railing? : None 6 Click Score: 24    End of Session   Activity Tolerance: Patient tolerated treatment well Patient left: in bed;with call bell/phone within reach;with family/visitor present Nurse Communication: Mobility status PT Visit Diagnosis: Muscle weakness (generalized) (M62.81)    Time: 9167-9155 PT Time Calculation (min) (ACUTE ONLY): 12 min   Charges:   PT Evaluation $PT Eval Low Complexity: 1 Low   PT General Charges $$ ACUTE PT VISIT: 1 Visit         Sabra Morel, PT, DPT  Acute Rehabilitation Services         Office: [phone]     Sabra MARLA Morel 12/31/2024, 12:39 PM

## 2025-01-01 ENCOUNTER — Other Ambulatory Visit (HOSPITAL_COMMUNITY): Payer: Self-pay

## 2025-01-01 ENCOUNTER — Telehealth (HOSPITAL_COMMUNITY): Payer: Self-pay

## 2025-01-01 DIAGNOSIS — K625 Hemorrhage of anus and rectum: Secondary | ICD-10-CM | POA: Diagnosis not present

## 2025-01-01 LAB — MAGNESIUM: Magnesium: 2 mg/dL (ref 1.7–2.4)

## 2025-01-01 LAB — BASIC METABOLIC PANEL WITH GFR
Anion gap: 7 (ref 5–15)
BUN: 10 mg/dL (ref 6–20)
CO2: 26 mmol/L (ref 22–32)
Calcium: 8.2 mg/dL — ABNORMAL LOW (ref 8.9–10.3)
Chloride: 105 mmol/L (ref 98–111)
Creatinine, Ser: 1.01 mg/dL — ABNORMAL HIGH (ref 0.44–1.00)
GFR, Estimated: >60 mL/min
Glucose, Bld: 236 mg/dL — ABNORMAL HIGH (ref 70–99)
Potassium: 4.2 mmol/L (ref 3.5–5.1)
Sodium: 139 mmol/L (ref 135–145)

## 2025-01-01 LAB — CBC WITH DIFFERENTIAL/PLATELET
Abs Immature Granulocytes: 0.04 K/uL (ref 0.00–0.07)
Basophils Absolute: 0 K/uL (ref 0.0–0.1)
Basophils Relative: 0 %
Eosinophils Absolute: 0.3 K/uL (ref 0.0–0.5)
Eosinophils Relative: 3 %
HCT: 25.4 % — ABNORMAL LOW (ref 36.0–46.0)
Hemoglobin: 8.1 g/dL — ABNORMAL LOW (ref 12.0–15.0)
Immature Granulocytes: 1 %
Lymphocytes Relative: 25 %
Lymphs Abs: 1.9 K/uL (ref 0.7–4.0)
MCH: 28.2 pg (ref 26.0–34.0)
MCHC: 31.9 g/dL (ref 30.0–36.0)
MCV: 88.5 fL (ref 80.0–100.0)
Monocytes Absolute: 0.5 K/uL (ref 0.1–1.0)
Monocytes Relative: 6 %
Neutro Abs: 4.9 K/uL (ref 1.7–7.7)
Neutrophils Relative %: 65 %
Platelets: 260 K/uL (ref 150–400)
RBC: 2.87 MIL/uL — ABNORMAL LOW (ref 3.87–5.11)
RDW: 12.8 % (ref 11.5–15.5)
WBC: 7.5 K/uL (ref 4.0–10.5)
nRBC: 0 % (ref 0.0–0.2)

## 2025-01-01 LAB — CBC
HCT: 27.2 % — ABNORMAL LOW (ref 36.0–46.0)
Hemoglobin: 8.8 g/dL — ABNORMAL LOW (ref 12.0–15.0)
MCH: 28.2 pg (ref 26.0–34.0)
MCHC: 32.4 g/dL (ref 30.0–36.0)
MCV: 87.2 fL (ref 80.0–100.0)
Platelets: 249 K/uL (ref 150–400)
RBC: 3.12 MIL/uL — ABNORMAL LOW (ref 3.87–5.11)
RDW: 12.9 % (ref 11.5–15.5)
WBC: 6.4 K/uL (ref 4.0–10.5)
nRBC: 0 % (ref 0.0–0.2)

## 2025-01-01 LAB — GLUCOSE, CAPILLARY
Glucose-Capillary: 219 mg/dL — ABNORMAL HIGH (ref 70–99)
Glucose-Capillary: 196 mg/dL — ABNORMAL HIGH (ref 70–99)

## 2025-01-01 MED ORDER — INSULIN GLARGINE 100 UNIT/ML SOLOSTAR PEN
10.0000 [IU] | PEN_INJECTOR | Freq: Two times a day (BID) | SUBCUTANEOUS | 0 refills | Status: AC
  Filled 2025-01-01: qty 6, 30d supply, fill #0

## 2025-01-01 MED ORDER — INSULIN LISPRO (1 UNIT DIAL) 100 UNIT/ML (KWIKPEN)
PEN_INJECTOR | SUBCUTANEOUS | 0 refills | Status: AC
  Filled 2025-01-01: qty 15, 50d supply, fill #0

## 2025-01-01 MED ORDER — LANCET DEVICE MISC
1.0000 | Freq: Three times a day (TID) | 0 refills | Status: AC
  Filled 2025-01-01: qty 1, 30d supply, fill #0

## 2025-01-01 MED ORDER — DOCUSATE SODIUM 100 MG PO CAPS
200.0000 mg | ORAL_CAPSULE | Freq: Every day | ORAL | 0 refills | Status: AC
  Filled 2025-01-01: qty 60, 30d supply, fill #0

## 2025-01-01 MED ORDER — LACTATED RINGERS IV BOLUS: 500.0000 mL | Freq: Once | INTRAVENOUS | Status: DC

## 2025-01-01 MED ORDER — POLYETHYLENE GLYCOL 3350 17 GM/SCOOP PO POWD
17.0000 g | Freq: Every day | ORAL | 0 refills | Status: DC
  Filled 2025-01-01: qty 238, 14d supply, fill #0

## 2025-01-01 MED ORDER — INSULIN PEN NEEDLE 32G X 4 MM MISC
0 refills | Status: AC
  Filled 2025-01-01: qty 100, 30d supply, fill #0

## 2025-01-01 MED ORDER — AMLODIPINE BESYLATE 5 MG PO TABS
5.0000 mg | ORAL_TABLET | Freq: Every day | ORAL | Status: DC
  Administered 2025-01-01: 5 mg via ORAL
  Filled 2025-01-01: qty 1

## 2025-01-01 MED ORDER — BLOOD GLUCOSE TEST VI STRP
1.0000 | ORAL_STRIP | Freq: Three times a day (TID) | 0 refills | Status: AC
  Filled 2025-01-01: qty 100, 30d supply, fill #0

## 2025-01-01 MED ORDER — LIVING WELL WITH DIABETES BOOK
Freq: Once | Status: AC
  Filled 2025-01-01: qty 1

## 2025-01-01 MED ORDER — INSULIN STARTER KIT- PEN NEEDLES (ENGLISH)
1.0000 | Freq: Once | Status: AC
  Administered 2025-01-01: 1
  Filled 2025-01-01: qty 1

## 2025-01-01 MED ORDER — BLOOD GLUCOSE MONITOR SYSTEM W/DEVICE KIT
1.0000 | PACK | Freq: Three times a day (TID) | 0 refills | Status: AC
  Filled 2025-01-01: qty 1, 30d supply, fill #0

## 2025-01-01 MED ORDER — ACCU-CHEK SOFTCLIX LANCETS MISC
1.0000 | 0 refills | Status: AC
  Filled 2025-01-01: qty 100, 30d supply, fill #0

## 2025-01-01 NOTE — Discharge Instructions (Addendum)
 Follow with Primary MD Neysa Tinnie BRAVO, PA in 7 days   Get CBC, CMP, Magnesium , 2 view Chest X ray -  checked next visit with your primary MD    Activity: As tolerated with Full fall precautions use walker/cane & assistance as needed  Disposition Home    Diet: Heart Healthy low carbohydrate diet.  Accuchecks 4 times/day, Once in AM empty stomach and then before each meal. Log in all results and show them to your Prim.MD in 3 days. If any glucose reading is under 80 or above 300 call your Prim MD immidiately. Follow Low glucose instructions for glucose under 80 as instructed.   Special Instructions: If you have smoked or chewed Tobacco  in the last 2 yrs please stop smoking, stop any regular Alcohol  and or any Recreational drug use.  On your next visit with your primary care physician please Get Medicines reviewed and adjusted.  Please request your Prim.MD to go over all Hospital Tests and Procedure/Radiological results at the follow up, please get all Hospital records sent to your Prim MD by signing hospital release before you go home.  If you experience worsening of your admission symptoms, develop shortness of breath, life threatening emergency, suicidal or homicidal thoughts you must seek medical attention immediately by calling 911 or calling your MD immediately  if symptoms less severe.  You Must read complete instructions/literature along with all the possible adverse reactions/side effects for all the Medicines you take and that have been prescribed to you. Take any new Medicines after you have completely understood and accpet all the possible adverse reactions/side effects.   Do not drive when taking Pain medications.  Do not take more than prescribed Pain, Sleep and Anxiety Medications  Wear Seat belts while driving.

## 2025-01-01 NOTE — Inpatient Diabetes Management (Addendum)
 Inpatient Diabetes Program Recommendations  AACE/ADA: New Consensus Statement on Inpatient Glycemic Control (2015)  Target Ranges:  Prepandial:   less than 140 mg/dL      Peak postprandial:   less than 180 mg/dL (1-2 hours)      Critically ill patients:  140 - 180 mg/dL   Lab Results  Component Value Date   GLUCAP 196 (H) 01/01/2025   HGBA1C 11.0 (H) 12/29/2024    Review of Glycemic Control  Latest Reference Range & Units 12/31/24 07:50 12/31/24 11:42 12/31/24 15:18 12/31/24 16:44 12/31/24 21:17 12/31/24 23:30 01/01/25 03:48 01/01/25 07:20  Glucose-Capillary 70 - 99 mg/dL 863 (H) 885 (H) 873 (H) 123 (H) 262 (H) 181 (H) 219 (H) 196 (H)  (H): Data is abnormally high Diabetes history: Type 2 DM Outpatient Diabetes medications: Metformin  500 mg BID Current orders for Inpatient glycemic control: Novolog  0-9 units Q4H  Inpatient Diabetes Program  Recommendations:    Consider adding Lantus  8 units every day and changing diet to carb mod when appropriate.   Will order LWWDM book, insulin  starter kit and dietitian consult. Secure chat sent to [facility] to determine benefits.   Spoke with patient regarding outpatient diabetes management. Patient reports being borderline diabetic.  Reviewed patient's current A1c of 11.2%. Explained what a A1c is and what it measures. Also reviewed goal A1c with patient, importance of good glucose control @ home, and blood sugar goals. Reviewed patho of DM, role of insulin , role of pancreas, hyper vs hypo glycemia, vascular changes, interventions, CGM, differences between long acting vs short acting insulin , how to check via finger stick, glucose trends while inpatient, dosages, and other commorbidities. Reviewed recommended frequency for checking CBGs. Dexcom reviewed with patient and applied to patietn's left upper arm. Patient has follow up appointment scheduled with PCP.  Admits to drinking sugary beverages. Reviewed alternatives, importance of protein, plate  method and basic carb counting. Placed referral for outpatient education.  Educated patient and spouse on insulin  pen use at home. Reviewed contents of insulin  flexpen starter kit. Reviewed all steps of insulin  pen including attachment of needle, 2-unit air shot, dialing up dose, giving injection, removing needle, disposal of sharps, storage of unused insulin , disposal of insulin  etc. Patient able to provide successful return demonstration. Also reviewed troubleshooting with insulin  pen. MD to give patient Rxs for insulin  pens and insulin  pen needles.    Thanks, Tinnie Minus, MSN, RNC-OB Diabetes Coordinator [phone] (8a-5p)

## 2025-01-01 NOTE — Plan of Care (Signed)
"                                    °  °                                    [facility]                            [address] [place], [place] [address]      Diane Reese was admitted to the Hospital on 12/29/2024 and Discharged  01/01/2025 and should be excused from work/school   for 4 days starting from date -  12/29/2024 , may return to work/school without any restrictions.  Call Lavada Stank MD, [facility]  [phone] with questions.  Lavada Stank M.D on 01/01/2025,at 11:16 AM  [facility]   Office  [phone]  "

## 2025-01-01 NOTE — Plan of Care (Signed)
"                                    °  °                                    [facility]                            [address] [place], [place] [address]      Please excuse Mr. Diane Reese 12-03-1969, ROM work for 4 days starting from date -  12/29/2024 , he was by his wife's bedside during her hospitalization from 12/29/2024 to 01/01/2025  Call Lavada Stank MD, [facility]  [phone] with questions.  Lavada Stank M.D on 01/01/2025,at 11:23 AM  [facility]   Office  [phone]  "

## 2025-01-01 NOTE — Telephone Encounter (Signed)
 Pharmacy Patient Advocate Encounter  Insurance verification completed.    The patient is insured through [facility]. Patient has [employer], may use a copay card, and/or apply for patient assistance if available.    Ran test claim for Lantus  100unit Pen and the current 30 day co-pay is $35.  Ran test claim for Basaglar  100unit Pen and the current 30 day co-pay is $35.  This test claim was processed through [facility]- copay amounts may vary at other pharmacies due to [employer], or as the patient moves through the different stages of their insurance plan.

## 2025-01-01 NOTE — Discharge Summary (Addendum)
 "                                                                                                                                                                               Discharge summary note.  Diane Reese FMW:[id number] DOB: 1971/10/09 DOA: 12/29/2024  PCP: Neysa Tinnie BRAVO, PA  Admit date: 12/29/2024  Discharge date: 01/01/2025  Admitted From: Home   Disposition:  Home   Recommendations for Outpatient Follow-up:   Follow up with PCP in 1-2 weeks  PCP Please obtain BMP/CBC, 2 view CXR in 1week,  (see Discharge instructions)   PCP Please follow up on the following pending results:    Home Health: None   Equipment/Devices: None  Consultations: GI Discharge Condition: Stable    CODE STATUS: Full    Diet Recommendation: Heart Healthy Low Carb    Chief Complaint  Patient presents with   Rectal Bleeding     Brief history of present illness from the day of admission and additional interim summary    54 year old female with history of hypertension, diabetes mellitus type 2, hyperlipidemia, basilar artery dissection takes aspirin  on a daily basis scented with dark stools per rectum. On presentation, hemoglobin was 10.2, down from 13 from May 2025. GI was consulted.                                                                    Hospital Course    Acute GI bleed with acute blood loss related anemia.  Patient does take aspirin  every day and had some upper GI discomfort prior to having dark stools and some bright red blood in stool as well, H&H dropped a little bit but overall stable did not require any transfusions, was placed on PPI seen by GI underwent colon[facility] Dr. Loni on 12/31/2024, consistent with diverticulosis and nonbleeding internal hemorrhoids likely source between the 2, bleeding seems to have resolved, symptom-free H&H stable will be discharged home on stool softeners with outpatient PCP and GI follow-up.  Avoid constipation.   Dyslipidemia.   Crestor  once taking orally.   Essential hypertension.  Blood pressure stable continue home medications   History of basilar artery dissection.  Now only on aspirin  and statin.  Continue.  She has stopped her Plavix .   Obesity.  BMI greater than 40.  Follow-up with PCP for weight loss.   DM type II.  Extremely high A1c, will be placed on insulin , diabetic insulin   education prior to discharge.  Lab Results  Component Value Date   HGBA1C 11.0 (H) 12/29/2024   CBG (last 3)  Recent Labs    12/31/24 2330 01/01/25 0348 01/01/25 0720  GLUCAP 181* 219* 196*    Discharge diagnosis     Principal Problem:   Rectal bleeding Active Problems:   Acute blood loss anemia (ABLA)   Type 2 diabetes mellitus with hyperglycemia (HCC)   Disorder of basilar artery   Essential hypertension   Hyperlipidemia    Discharge instructions    Discharge Instructions     Discharge instructions   Complete by: As directed    Follow with Primary MD Neysa Tinnie BRAVO, PA in 7 days   Get CBC, CMP, Magnesium , 2 view Chest X ray -  checked next visit with your primary MD    Activity: As tolerated with Full fall precautions use walker/cane & assistance as needed  Disposition Home    Diet: Heart Healthy low carbohydrate diet.  Accuchecks 4 times/day, Once in AM empty stomach and then before each meal. Log in all results and show them to your Prim.MD in 3 days. If any glucose reading is under 80 or above 300 call your Prim MD immidiately. Follow Low glucose instructions for glucose under 80 as instructed.   Special Instructions: If you have smoked or chewed Tobacco  in the last 2 yrs please stop smoking, stop any regular Alcohol  and or any Recreational drug use.  On your next visit with your primary care physician please Get Medicines reviewed and adjusted.  Please request your Prim.MD to go over all Hospital Tests and Procedure/Radiological results at the follow up, please get all Hospital records  sent to your Prim MD by signing hospital release before you go home.  If you experience worsening of your admission symptoms, develop shortness of breath, life threatening emergency, suicidal or homicidal thoughts you must seek medical attention immediately by calling 911 or calling your MD immediately  if symptoms less severe.  You Must read complete instructions/literature along with all the possible adverse reactions/side effects for all the Medicines you take and that have been prescribed to you. Take any new Medicines after you have completely understood and accpet all the possible adverse reactions/side effects.   Do not drive when taking Pain medications.  Do not take more than prescribed Pain, Sleep and Anxiety Medications  Wear Seat belts while driving.   Increase activity slowly   Complete by: As directed        Discharge Medications   Allergies as of 01/01/2025       Reactions   Penicillins Hives        Medication List     STOP taking these medications    clopidogrel  75 MG tablet Commonly known as: PLAVIX        TAKE these medications    Accu-Chek Softclix Lancets lancets 1 each as directed. Dispense based on patient and insurance preference. Use up to four times daily as directed. (FOR ICD-10 E10.9, E11.9).   ALPRAZolam  1 MG tablet Commonly known as: XANAX  Take 1 tablet (1 mg total) by mouth at bedtime as needed for anxiety (to take prior to MRI. Can take a second dose if needed.).   amLODipine  5 MG tablet Commonly known as: NORVASC  Take 1 tablet (5 mg total) by mouth daily.   aspirin  EC 81 MG tablet Take 1 tablet (81 mg total) by mouth daily. Swallow whole.   Blood Glucose Monitor System w/Device Kit  1 each by Does not apply route in the morning, at noon, and at bedtime. May substitute to any manufacturer covered by patient's insurance.   BLOOD GLUCOSE TEST STRIPS Strp 1 each by In Vitro route in the morning, at noon, and at bedtime. May substitute  to any manufacturer covered by patient's insurance.   docusate sodium  100 MG capsule Commonly known as: Colace Take 2 capsules (200 mg total) by mouth daily.   insulin  aspart 100 UNIT/ML FlexPen Commonly known as: NOVOLOG  Before each meal 3 times a day, 140-199 - 2 units, 200-250 - 4 units, 251-299 - 6 units,  300-349 - 8 units,  350 or above 10 units.   insulin  glargine 100 UNIT/ML Solostar Pen Commonly known as: LANTUS  Inject 10 Units into the skin 2 (two) times daily.   Insulin  Pen Needle 32G X 4 MM Misc Please provide 1 month supply   Lancet Device Misc 1 each by Does not apply route in the morning, at noon, and at bedtime. May substitute to any manufacturer covered by patient's insurance.   metFORMIN  500 MG tablet Commonly known as: GLUCOPHAGE  Take 1 tablet (500 mg total) by mouth 2 (two) times daily with a meal.   polyethylene glycol powder 17 GM/SCOOP powder Commonly known as: GLYCOLAX /MIRALAX  Take 17 g by mouth daily. Dissolve 1 capful (17g) in 4-8 ounces of liquid and take by mouth daily.   Vitamin D (Ergocalciferol) 1.25 MG (50000 UNIT) Caps capsule Commonly known as: DRISDOL Take 50,000 Units by mouth once a week.         Follow-up Information     Neysa Tinnie BRAVO, PA. Schedule an appointment as soon as possible for a visit in 1 week(s).   Specialty: Physician Assistant Contact information: [address] [place] [address] [phone]         Saintclair Jasper, MD. Schedule an appointment as soon as possible for a visit in 2 week(s).   Specialty: Gastroenterology Contact information: [address] [place] [address] [phone]                 Major procedures and Radiology Reports - PLEASE review detailed and final reports thoroughly  -      No results found.  Micro Results    Recent Results (from the past 240 hours)  MRSA Next Gen by PCR, Nasal     Status: None   Collection Time: 12/31/24  8:58 AM   Specimen:  Nasal Mucosa; Nasal Swab  Result Value Ref Range Status   MRSA by PCR Next Gen NOT DETECTED NOT DETECTED Final    Comment: (NOTE) The GeneXpert MRSA Assay (FDA approved for NASAL specimens only), is one component of a comprehensive MRSA colonization surveillance program. It is not intended to diagnose MRSA infection nor to guide or monitor treatment for MRSA infections. Test performance is not FDA approved in patients less than 43 years old. Performed at [facility], [address], [place], [place] [address]     Today   Subjective    Diane Reese today has no headache,no chest abdominal pain,no new weakness tingling or numbness, feels much better wants to go home today.    Objective   Blood pressure 150/80, pulse 82, temperature 98 F (36.7 C), temperature source Oral, resp. rate 18, height 5' 1 (1.549 m), weight 102.1 kg, SpO2 97%.   Intake/Output Summary (Last 24 hours) at 01/01/2025 1115 Last data filed at 01/01/2025 0900 Gross per 24  hour  Intake 220 ml  Output --  Net 220 ml    Exam  Awake Alert, No new F.N deficits,    [place].AT,PERRAL Supple Neck,   Symmetrical Chest wall movement, Good air movement bilaterally, CTAB RRR,No Gallops,   +ve B.Sounds, Abd Soft, Non tender,  No Cyanosis, Clubbing or edema    Data Review   Recent Labs  Lab 12/30/24 0542 12/31/24 0345 12/31/24 1519 01/01/25 0349 01/01/25 1018  WBC 9.4 9.1 8.5 7.5 6.4  HGB 8.6* 8.7* 9.1* 8.1* 8.8*  HCT 26.9* 27.0* 28.6* 25.4* 27.2*  PLT 221 244 200 260 249  MCV 87.3 86.5 87.2 88.5 87.2  MCH 27.9 27.9 27.7 28.2 28.2  MCHC 32.0 32.2 31.8 31.9 32.4  RDW 12.8 12.7 12.5 12.8 12.9  LYMPHSABS 2.8 2.3  --  1.9  --   MONOABS 0.5 0.5  --  0.5  --   EOSABS 0.3 0.2  --  0.3  --   BASOSABS 0.0 0.0  --  0.0  --     Recent Labs  Lab 12/29/24 0920 12/30/24 0542 12/31/24 0345 01/01/25 0349  NA 139 141 140 139  K 3.7 3.5 3.5 4.2  CL 102 105 106 105  CO2 26 28 25 26   ANIONGAP 11 8 9 7    GLUCOSE 319* 182* 141* 236*  BUN 22* 17 9 10   CREATININE 0.92 0.75 0.72 1.01*  AST 13*  --   --   --   ALT 10  --   --   --   ALKPHOS 87  --   --   --   BILITOT 0.4  --   --   --   ALBUMIN 3.7  --   --   --   INR 1.1  --   --   --   HGBA1C 11.0*  --   --   --   MG  --   --  1.8 2.0  CALCIUM  8.4* 8.1* 8.1* 8.2*    Total Time in preparing paper work, data evaluation and todays exam - 35 minutes  Signature  -    Lavada Stank M.D on 01/01/2025 at 11:15 AM   -  To page go to [email or web address]      "

## 2025-01-02 ENCOUNTER — Encounter (HOSPITAL_COMMUNITY): Payer: Self-pay | Admitting: Gastroenterology

## 2025-01-05 NOTE — Telephone Encounter (Signed)
 Pharmacy Patient Advocate Encounter  Received notification from [facility] that Prior Authorization for Dexcom G7 Sensor has been APPROVED from 01/04/25 to 01/04/26   PA #/Case ID/Reference #: [id number]

## 2025-01-06 ENCOUNTER — Other Ambulatory Visit: Payer: Self-pay | Admitting: Pharmacist

## 2025-01-06 ENCOUNTER — Telehealth: Payer: Self-pay | Admitting: Pharmacist

## 2025-01-06 NOTE — Patient Instructions (Addendum)
 Ms. Chouinard,   It was great talking to you today!  Talk to your primary care provider about:  - Restarting metformin  - Blood pressure medications - Cholesterol goals  Blood Sugars: Continue checking your blood sugar before meals.   For a goal A1c of less than 7%, goal fasting readings are less than 130 and goal 2 hour after meal readings are less than 180.    Here are some resources:   https://diabetes.org/living-with-diabetes/type-2  https://diabetes.org/living-with-diabetes  https://diabetes.org/food-nutrition/eating-for-diabetes-management   Blood Pressure:  I recommend purchasing an [employer] Upper Arm automated blood pressure cuff.   Check your blood pressure periodically, and any time you have concerning symptoms like headache, chest pain, dizziness, shortness of breath, or vision changes.   Our goal is less than 130/80.  To appropriately check your blood pressure, make sure you do the following:  1) Avoid caffeine, exercise, or tobacco products for 30 minutes before checking. Empty your bladder. 2) Sit with your back supported in a flat-backed chair. Rest your arm on something flat (arm of the chair, table, etc). 3) Sit still with your feet flat on the floor, resting, for at least 5 minutes.  4) Check your blood pressure. Take 1-2 readings.  5) Write down these readings and bring with you to any provider appointments.  Bring your home blood pressure machine with you to a provider's office for accuracy comparison at least once a year.   Make sure you take your blood pressure medications before you go to any office visit, even if you were asked to fast for labs.   Cholesterol: We generally recommend patients with a diagnosis of diabetes reduce their risk of heart disease by taking a statin medication to lower bad cholesterol (LDL) and reduce the likelihood of cholesterol build up in blood vessels, which can lead to a heart attack. Talk to your primary care provider about  starting this type of medication.   Reach out with any questions between now and when you see your PCP in February!  Catie IVAR Centers, PharmD, [facility] Clinical Pharmacist [phone]

## 2025-01-06 NOTE — Progress Notes (Unsigned)
 "  01/06/2025 Name: Diane Reese MRN: [id number] DOB: 12-02-1971  Chief Complaint  Patient presents with   Medication Management   Diabetes    Diane Reese is a 54 y.o. year old female who presented for a telephone visit.   They were referred to the pharmacist by an Inpatient team member (diabetes educators) for assistance in managing diabetes.    Subjective:  Care Team: Primary Care Provider: Neysa Tinnie BRAVO, PA ; Next Scheduled Visit: 01/23/24  Medication Access/Adherence  Current Pharmacy:  [id number] - [place], [place] - [address] [facility] [address] [place]  [address] Phone: [phone] Fax: [phone]  [facility] [place] [facility] [address] [place] [address] Phone: [phone] Fax: [phone]   Patient reports affordability concerns with their medications: No  Patient reports access/transportation concerns to their pharmacy: No  Patient reports adherence concerns with their medications:  No     Diabetes:  Current medications: Lantus  10 units twice daily - taking daily; Humalog  per sliding scale (140-199 - 2 units, 200-250 - 4 units, 251-299 - 6 units,  300-349 - 8 units,  350 or above 10 units) Medications tried in the past: previously on metformin  - reports it was previously discontinued by her PCP, but she tolerated well.   Current glucose readings: reports one reading was in the 300s (sugar in oatmeal);  Before breakfast: 170s-180s Before lunch: 160-170s;  Before bed: 200s - has been taking Humalog  at this time, after supper  Patient reports 2 episodes of hypoglycemic s/sx including dizziness, shakiness, sweating; occurred when she was first discharged, but has not happened since.  Current meal patterns:  - Breakfast: oatmeal w/ water;  - Lunch: small portion of macaroni and cheese; hamburger steak;  - Supper: baking chicken, broccoli, rice;  -  Snacks: not usually,  - Drinks: had been drinking soda;   Current physical activity: none at this time  Macrovascular and Microvascular Risk Reduction:  Statin? no; no history of statin therapy; ACEi/ARB? No history of therapy, likely indicated Last urinary albumin/creatinine ratio needs:    Hypertension:  Current medications: previously prescribed amlodipine  but was discontinued  Patient does not have a validated, automated, upper arm home BP cuff  Hyperlipidemia/ASCVD Risk Reduction  Current lipid lowering medications: none   PREVENT Risk Score:  [email or web address].fr - 10 year risk of CVD: 9.7% - 10 year risk of ASCVD: 6.2% - 10 year risk of HF: 8.2%    Objective:  Lab Results  Component Value Date   HGBA1C 11.0 (H) 12/29/2024    Lab Results  Component Value Date   CREATININE 1.01 (H) 01/01/2025   BUN 10 01/01/2025   NA 139 01/01/2025   K 4.2 01/01/2025   CL 105 01/01/2025   CO2 26 01/01/2025    Lab Results  Component Value Date   CHOL 167 04/27/2024   HDL 45 04/27/2024   LDLCALC 102 (H) 04/27/2024   TRIG 102 04/27/2024   CHOLHDL 3.7 04/27/2024    Medications Reviewed Today     Reviewed by Rudy Dorothyann DASEN, RPH-CPP (Pharmacist) on 01/06/25 at 0945  Med List Status: <None>   Medication Order Taking? Sig Documenting Provider Last Dose Status Informant  Accu-Chek Softclix Lancets lancets [id number] Yes Use 1 each up to four times daily as directed. Dispense based on patient and insurance preference.  (FOR ICD-10 E10.9, E11.9). Singh, Prashant K, MD  Active  ALPRAZolam  (XANAX ) 1 MG tablet [id number]  Take 1 tablet (1 mg total) by mouth at bedtime as needed for anxiety (to take prior to MRI. Can take a second dose if needed.).  Patient not taking: Reported on 12/29/2024   Camara, Amadou, MD  Consider Medication Status and Discontinue (Completed Course) Self  amLODipine   (NORVASC ) 5 MG tablet [id number]  Take 1 tablet (5 mg total) by mouth daily.  Patient not taking: Reported on 01/06/2025   Raenelle Coria, MD  Active Self  aspirin  EC 81 MG tablet [id number] Yes Take 1 tablet (81 mg total) by mouth daily. Swallow whole. Raenelle Coria, MD  Active Self  Blood Glucose Monitoring Suppl (BLOOD GLUCOSE MONITOR SYSTEM) w/Device KIT [id number] Yes Use 1 each in the morning, at noon, and at bedtime. May substitute to any manufacturer covered by patient's insurance. Singh, Prashant K, MD  Active   docusate sodium  (COLACE) 100 MG capsule [id number] Yes Take 2 capsules (200 mg total) by mouth daily. Singh, Prashant K, MD  Active   Glucose Blood (BLOOD GLUCOSE TEST STRIPS) STRP [id number] Yes Use 1 each in the morning, at noon, and at bedtime. May substitute to any manufacturer covered by patient's insurance. Singh, Prashant K, MD  Active   insulin  glargine (LANTUS ) 100 UNIT/ML Solostar Pen [id number] Yes Inject 10 Units into the skin 2 (two) times daily.  Patient taking differently: Inject 10 Units into the skin daily.   Singh, Prashant K, MD  Active   insulin  lispro (HUMALOG ) 100 UNIT/ML KwikPen [id number] Yes Before each meal 3 times a day, 140-199 - 2 units, 200-250 - 4 units, 251-299 - 6 units,  300-349 - 8 units,  350 or above 10 units. Singh, Prashant K, MD  Active   Insulin  Pen Needle 32G X 4 MM MISC [id number] Yes Please provide 1 month supply Singh, Prashant K, MD  Active   Lancet Device MISC [id number] Yes 1 each by Does not apply route in the morning, at noon, and at bedtime. May substitute to any manufacturer covered by patient's insurance. Singh, Prashant K, MD  Active   metFORMIN  (GLUCOPHAGE ) 500 MG tablet [id number]  Take 1 tablet (500 mg total) by mouth 2 (two) times daily with a meal.  Patient not taking: Reported on 01/06/2025   Raenelle Coria, MD  Active Self    Discontinued 01/06/25 0945 (Completed Course)   Vitamin D, Ergocalciferol, (DRISDOL) 1.25 MG (50000 UNIT)  CAPS capsule [id number] Yes Take 50,000 Units by mouth once a week. [provider]  Active Self              Assessment/Plan:   Diabetes: - Currently uncontrolled; goal A1c <7%. Cardiorenal risk reduction has opportunities for improvement.. Blood pressure is not at goal <130/80. LDL is not at goal.  - Reviewed long term cardiovascular and renal outcomes of uncontrolled blood sugar., Reviewed goal A1c, goal fasting, and goal 2 hour post prandial glucose. Recommended to check glucose as prescribed 3 times daily, Reviewed dietary modifications including plate method., Reviewed lifestyle modifications including goal 150 minutes moderate intensity exercise weekly., and Reviewed signs and symptoms of hypoglycemia. - Recommend to discuss re-initiation of metformin  therapy with PCP at next visit. Can likely discontinue Humalog  at that time, eventually decrease and discontinue Lantus . Could consider GLP1 therapy moving forward due to comorbidity of obesity  Hypertension: - Currently uncontrolled per last documented ambulatory blood pressure.  - Reviewed long term cardiovascular and renal outcomes of uncontrolled blood pressure - Reviewed appropriate blood pressure  monitoring technique and reviewed goal blood pressure. Recommended to check home blood pressure and heart rate periodically after purchase of home cuff. - Recommend to consider initiation of ARB instead of amlodipine  given diagnosis of diabetes. Recommend uACR>    Hyperlipidemia/ASCVD Risk Reduction: - Currently uncontrolled.  - Recommend to discuss statin therapy with PCP moving forward.     Follow Up Plan: follow up with PCP as scheduled.   Catie IVAR Centers, PharmD, BCACP Clinical Pharmacist [phone]    "

## 2025-01-06 NOTE — Progress Notes (Signed)
 Contacted patient regarding referral for diabetes from the inpatient diabetes educator   Left patient a voicemail to return my call at their convenience  Catie TSABRA Centers, PharmD, [facility] Clinical Pharmacist [phone]
